# Patient Record
Sex: Female | Born: 1965 | Race: White | Hispanic: No | Marital: Married | Smoking: Never smoker
Health system: Southern US, Community
[De-identification: ages and names within clinical notes are randomized; demographics above are authoritative.]

## PROBLEM LIST (undated history)

## (undated) DIAGNOSIS — E119 Type 2 diabetes mellitus without complications: Secondary | ICD-10-CM

## (undated) DIAGNOSIS — N183 Chronic kidney disease, stage 3 (moderate): Secondary | ICD-10-CM

## (undated) DIAGNOSIS — N289 Disorder of kidney and ureter, unspecified: Secondary | ICD-10-CM

## (undated) DIAGNOSIS — E1122 Type 2 diabetes mellitus with diabetic chronic kidney disease: Secondary | ICD-10-CM

## (undated) DIAGNOSIS — L0390 Cellulitis, unspecified: Secondary | ICD-10-CM

## (undated) DIAGNOSIS — J45909 Unspecified asthma, uncomplicated: Secondary | ICD-10-CM

## (undated) DIAGNOSIS — G43909 Migraine, unspecified, not intractable, without status migrainosus: Secondary | ICD-10-CM

---

## 2001-01-27 ENCOUNTER — Observation Stay (HOSPITAL_COMMUNITY): Admission: EM | Admit: 2001-01-27 | Discharge: 2001-01-28 | Payer: Self-pay | Admitting: Emergency Medicine

## 2001-01-28 ENCOUNTER — Encounter: Payer: Self-pay | Admitting: *Deleted

## 2002-09-21 ENCOUNTER — Encounter: Payer: Self-pay | Admitting: *Deleted

## 2002-09-21 ENCOUNTER — Emergency Department (HOSPITAL_COMMUNITY): Admission: EM | Admit: 2002-09-21 | Discharge: 2002-09-21 | Payer: Self-pay

## 2003-08-17 DIAGNOSIS — E119 Type 2 diabetes mellitus without complications: Secondary | ICD-10-CM

## 2003-08-17 HISTORY — DX: Type 2 diabetes mellitus without complications: E11.9

## 2011-01-01 NOTE — Discharge Summary (Signed)
[place]. [facility]  Patient:    Rose Murray, Rose Murray                       MRN: [id number] Adm. Date:  [id number] Disc. Date: [id number] Attending:  Glennon Hamilton Dictator:   Delton See, P.A. CC:         Delaney Meigs, M.D.   Referring Physician Discharge [place]  DATE OF BIRTH:  [birth year]  HISTORY OF PRESENT ILLNESS:  Mrs. Rose Murray is a 45 year old female with a history of supraventricular tachycardia seen in the past by Dr. Graciela Husbands some time in 1998.  She has been treated with Cardizem for her SVT.  She was admitted through the emergency department to [facility] on January 27, 2001 with substernal chest pain which had been present for approximately one week.  Nitroglycerin helped.  There has been no shortness of breath or diaphoresis associated with this chest pain.  The pain was not felt to be exertional.  She had called her primary physicians office and was told to come to the emergency room for further evaluation.  She was pain-free when seen in the emergency room.  PAST MEDICAL HISTORY:  Significant for hypertension, degenerative joint disease, and gastroesophageal reflux disease.  ALLERGIES:  PENICILLIN.  MEDICATIONS:  Nexium and Cardizem - dosage is not available.  SOCIAL HISTORY:  The patient does not smoke.  She is married.  HOSPITAL COURSE:  As noted, this patient was admitted to [facility] for further evaluation of chest pain.  She was seen by Dr. Lewayne Bunting at time of admission.  Her pain was felt to be atypical.  She was to have enzymes checked and to be observed on telemetry.  She had more pain and inpatient exercise Cardiolite would be performed; if she was pain-free and her enzymes were negative she would be sent home with plans for an outpatient Cardiolite.  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  She refused to have an IV.  She refused to have further enzymes drawn.  The initial  enzymes drawn were reportedly negative.  She was seen on rounds by Dr. Eden Emms on January 28, 2001.  The patient was convinced that her symptoms were coming from her gallbladder.  A gallbladder ultrasound was ordered.  This came back negative except for a fatty liver. The plan was to proceed with discharge.  LABORATORY DATA:  Cardiac enzymes were negative x 1.  INR 1.1.  As noted, the patient refused to have further labs drawn.  EKG showed normal sinus rhythm, rate 85 beats per minute, and was interpreted as a normal EKG.  As noted, a gallbladder ultrasound was negative except for a fatty liver.  DISCHARGE MEDICATIONS:  The patient was told to continue her same medications, Nexium and Cardizem, as previously taken.  ACTIVITY:  As tolerated.  DIET:  As previously.  FOLLOW-UP:  As noted, the patient was to be scheduled for an outpatient stress test.  She was told to call Dr. Koren Bound office on Monday for follow-up appointment.  She was to see Dr. Lysbeth Galas in one to two weeks.  PROBLEM LIST AT TIME OF DISCHARGE: 1. Chest pain felt to be atypical.  The patient refused to have further    cardiac enzymes drawn.  She refused to have an IV. 2. Gallbladder ultrasound performed this admission negative except for a fatty    liver. 3. History of hypertension. 4. History of degenerative joint  disease. 5. History of gastroesophageal reflux disease. 6. History of supraventricular tachycardia from 1998. DD:  01/28/01 TD:  01/28/01 Job: [id number]

## 2011-01-01 NOTE — H&P (Signed)
[place]. [facility]  Patient:    Rose Murray, Rose Murray                       MRN: [id number] Adm. Date:  [id number] Attending:  Glennon Hamilton Dictator:   Abelino Derrick, P.A.C. [facility]                         History and Physical  HISTORY OF PRESENT ILLNESS:  Ms. Rose Murray is a 45 year old female followed by Dr. Kathi Der who was referred to the emergency room tonight for evaluation of chest pain. The patient complains of intermittent chest tightness since Sunday. There is no nausea, vomiting, or diaphoresis with this. She has had some reflux symptoms and is scheduled to have a gallbladder ultrasound next week. Apparently she did take a nitroglycerin and this seemed to help. She has seen our group in the past, Dr. Graciela Husbands saw her for PSVT, based on symptoms.  PAST MEDICAL HISTORY:  Remarkable for hypertension and DJD. She had a normal echo July of 1998.  CURRENT MEDICATIONS:  Cardizem and Nexium.  ALLERGIES:  PENICILLIN which causes shortness of breath and a rash.  SOCIAL HISTORY:  She is a nonsmoker, she is married.  FAMILY HISTORY:  Father died in his 10s of coronary disease. Mother is alive at 62.  REVIEW OF SYSTEMS:  Essentially unremarkable except as noted above. There is no history of peptic ulcer disease, or GI bleeding. She has had palpitations as noted in the past associated with presyncope.  PHYSICAL EXAMINATION:  VITAL SIGNS:  Blood pressure 117/76, pulse 85, respirations 18.  GENERAL:  She is a well-developed, obese female in no acute distress.  HEENT:  Normocephalic. Extraocular movements intact. Sclera is nonicteric. Conjunctiva within normal limits.  NECK:  ______ without JVD.  CHEST:  Clear to auscultation and percussion.  CARDIAC:  Reveals a regular rate and rhythm without murmurs, rubs or gallops. Normal S1, S2.  ABDOMEN:  Obese, nontender.  EXTREMITIES:  Without edema. Distal pulses are intact.  LABORATORY DATA:  EKG shows sinus  rhythm without acute changes.  Labs done recently sent with the patient from January 26, 2001 show a LDL of 59, HDL 46, BUN 9, creatinine 0.6, sodium 135, potassium 4.7, TSH 2.66, amylase 31, white count 7.7, hemoglobin 12.6, hematocrit 39.2, platelets 258.  IMPRESSION: 1. Chest pain, rule out... DD:  01/27/01 TD:  01/27/01 Job: [id number]

## 2011-01-01 NOTE — Consult Note (Signed)
Rose Murray, HINKSON NO.:  [id number]   MEDICAL RECORD NO.:  [id number]                   PATIENT TYPE:  EMS   LOCATION:  MINO                                 FACILITY:  [facility]   PHYSICIAN:  Candy Sledge, M.D.            DATE OF BIRTH:  13-Dec-1965   DATE OF CONSULTATION:  09/21/2002  DATE OF DISCHARGE:                                   CONSULTATION   REFERRING PHYSICIAN:  Gaynelle Cage, M.D. in [place].   CHIEF COMPLAINT:  Bilateral facial weakness.   HISTORY OF PRESENT ILLNESS:  The patient is a 45 year old right-handed  married female.  She had an impacted molar in 11/03, treated with  erythromycin initially.  Then her tooth was pulled in 12/03.  She was  somewhat better after, and then suddenly hearing in her left ear with  swelling around the ear and drainage out of the ear at one point.  Her  hearing came back subsequently, and then she started running fevers and had  swelling of lymph nodes.  She then developed sores on the tip of her tongue  and the roof of her mouth.  She presented to [facility] on 08/31/02, and was  treated with Levaquin which made her feel much worse.  On 09/01/02, she awoke  with left facial weakness, and on 09/02/02, had right facial weakness as  well.  She saw Paulita Cradle at [facility], and was  given [facility] mouthwash.  Her sores cleared up.  She was running fevers off  and on for the past couple of weeks, and complaining of migratory head pain  and swelling of her jaw.  She has had no improvement of the facial weakness  over the past couple of weeks.   LABORATORY DATA:  Normal white blood cell count.  Labs drawn two weeks ago  were probably hemolyzed and showed an elevated potassium of 8.2, and a  glucose of 250.  There were elevated transaminases as well.  These findings  were presumably being repeated.   PAST MEDICAL HISTORY:  1. Borderline diabetes.  2. She has been previously treated  for hypertension, but not currently.  3. She has an unknown heart rhythm problem.   MEDICATIONS:  On no chronic medications.   ALLERGIES:  1. PENICILLIN.  2. VICODIN.  3. BENADRYL.  4. LEVAQUIN.   FAMILY HISTORY:  Father deceased at an early age with a myocardial  infarction.  Mother has borderline diabetes.   SOCIAL HISTORY:  The patient is married, has one son.  She does not smoke or  use alcohol.   PHYSICAL EXAMINATION:  GENERAL:  This is a well-developed, well-nourished,  overweight, pleasant lady in no acute distress.  VITAL SIGNS:  Blood pressure is 143/83, pulse 84, respirations 16,  temperature 100.6.  HEENT: Cranium is normocephalic, atraumatic.  Oropharynx reveals mild  erythema involving the tonsillar pillars.  There are no  obvious sores.  The  external auditory canals are clear.  Tympanic membranes show good light  reflex.  NECK:  Supple without bruits.  I palpate no enlarged lymph nodes.  HEART:  Regular rate and rhythm without murmurs.  LUNGS:  Clear to auscultation.  EXTREMITIES:  Without cyanosis, clubbing, or edema.  NEUROLOGIC:  The patient is alert and oriented.  His speech is normal.  Extraocular movements are full and without nystagmus.  Pupils equal, round,  reactive to light.  Visual fields are full to confrontation.  Funduscopic  examination reveals sharp disk margins.  Facial sensation is intact  bilaterally.  There is bilateral focal facial weakness, the patient is  unable to wrinkle up the forehead or smile.  She is able to occlude both  eyes.  Tongue is diffuse in the midline.  Hearing is intact to finger rub  bilaterally.  Motor assessment reveals negative rabarre.  There is 5/5  strength throughout the upper and lower extremities.  Deep tendon reflexes  are 2+ and symmetric, and the plantar responses are downgoing bilaterally.  Sensation examination is intact to pinprick, temperature, and vibratory  sensation.  Cerebellar testing reveals normal  finger-nose-finger.   IMPRESSION:  Bilateral seventh nerve palsy, question viral.  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  The patient's  blood sugar previously drawn of 254, is somewhat disturbing, and she may be  going in to a phase of frank diabetes.  Certainly, seventh nerve palsies are  more common with this condition.  An MRI of the brain was obtained to rule  out intracranial pathology, and the patient refused contrast.  This was a  normal study.   PLAN:  Discharge the patient to home.  She will treat her low-grade  temperatures with Tylenol.  MRI did show some maxillary sinus disease which  may be a source of her temperatures.  I would certainly follow up on her  labs and recheck her sugars as needed.  She is not a candidate for a  specific treatment with prednisone or Valtrex at this time, and her  prognosis generally should be quite good for recovery of facial strength.  I  have instructed her to apply Lacrilube ointment to her eyes at night, use  artificial tears as needed during the day.  We will be happy to follow up  with her in the office as needed.   I appreciate the opportunity to evaluate this interesting patient.                                                Candy Sledge, M.D.    JJS/MEDQ  D:  09/21/2002  T:  09/22/2002  Job:  [id number]   cc:   Gaynelle Cage  [address]  [place]  [place] [address]  Fax: [phone]

## 2012-08-16 DIAGNOSIS — E1122 Type 2 diabetes mellitus with diabetic chronic kidney disease: Secondary | ICD-10-CM

## 2012-08-16 DIAGNOSIS — N183 Chronic kidney disease, stage 3 unspecified: Secondary | ICD-10-CM

## 2012-08-16 HISTORY — DX: Type 2 diabetes mellitus with diabetic chronic kidney disease: E11.22

## 2012-08-16 HISTORY — DX: Chronic kidney disease, stage 3 unspecified: N18.30

## 2013-02-03 DIAGNOSIS — R05 Cough: Secondary | ICD-10-CM

## 2013-10-15 ENCOUNTER — Telehealth: Payer: Self-pay | Admitting: *Deleted

## 2014-05-03 ENCOUNTER — Encounter (HOSPITAL_COMMUNITY): Payer: Self-pay | Admitting: Emergency Medicine

## 2014-05-03 ENCOUNTER — Emergency Department (HOSPITAL_COMMUNITY)
Admission: EM | Admit: 2014-05-03 | Discharge: 2014-05-04 | Disposition: A | Discharge status: Home / Self Care | Payer: Self-pay | Attending: Emergency Medicine | Admitting: Emergency Medicine

## 2014-05-03 DIAGNOSIS — N183 Chronic kidney disease, stage 3 unspecified: Secondary | ICD-10-CM | POA: Insufficient documentation

## 2014-05-03 DIAGNOSIS — I87309 Chronic venous hypertension (idiopathic) without complications of unspecified lower extremity: Secondary | ICD-10-CM | POA: Insufficient documentation

## 2014-05-03 DIAGNOSIS — E1129 Type 2 diabetes mellitus with other diabetic kidney complication: Secondary | ICD-10-CM | POA: Insufficient documentation

## 2014-05-03 DIAGNOSIS — M7989 Other specified soft tissue disorders: Secondary | ICD-10-CM | POA: Insufficient documentation

## 2014-05-03 DIAGNOSIS — Z872 Personal history of diseases of the skin and subcutaneous tissue: Secondary | ICD-10-CM | POA: Insufficient documentation

## 2014-05-03 DIAGNOSIS — R600 Localized edema: Secondary | ICD-10-CM

## 2014-05-03 DIAGNOSIS — J45909 Unspecified asthma, uncomplicated: Secondary | ICD-10-CM | POA: Insufficient documentation

## 2014-05-03 DIAGNOSIS — I87303 Chronic venous hypertension (idiopathic) without complications of bilateral lower extremity: Secondary | ICD-10-CM

## 2014-05-03 HISTORY — DX: Cellulitis, unspecified: L03.90

## 2014-05-03 HISTORY — DX: Chronic kidney disease, stage 3 (moderate): N18.3

## 2014-05-03 HISTORY — DX: Type 2 diabetes mellitus with diabetic chronic kidney disease: E11.22

## 2014-05-03 HISTORY — DX: Unspecified asthma, uncomplicated: J45.909

## 2014-05-03 HISTORY — DX: Type 2 diabetes mellitus without complications: E11.9

## 2014-05-03 HISTORY — DX: Disorder of kidney and ureter, unspecified: N28.9

## 2014-05-03 LAB — COMPREHENSIVE METABOLIC PANEL
ALT: 10 U/L (ref 0–35)
AST: 23 U/L (ref 0–37)
Albumin: 2.5 g/dL — ABNORMAL LOW (ref 3.5–5.2)
Alkaline Phosphatase: 64 U/L (ref 39–117)
Anion gap: 8 (ref 5–15)
BUN: 21 mg/dL (ref 6–23)
CO2: 25 mEq/L (ref 19–32)
Calcium: 8.2 mg/dL — ABNORMAL LOW (ref 8.4–10.5)
Chloride: 107 mEq/L (ref 96–112)
Creatinine, Ser: 1.22 mg/dL — ABNORMAL HIGH (ref 0.50–1.10)
GFR calc Af Amer: 59 mL/min — ABNORMAL LOW (ref >90)
GFR calc non Af Amer: 51 mL/min — ABNORMAL LOW (ref >90)
Glucose, Bld: 119 mg/dL — ABNORMAL HIGH (ref 70–99)
Potassium: 4.2 mEq/L (ref 3.7–5.3)
Sodium: 140 mEq/L (ref 137–147)
Total Bilirubin: <0.2 mg/dL — ABNORMAL LOW (ref 0.3–1.2)
Total Protein: 6.3 g/dL (ref 6.0–8.3)

## 2014-05-03 LAB — CBC
HCT: 30.2 % — ABNORMAL LOW (ref 36.0–46.0)
Hemoglobin: 9.9 g/dL — ABNORMAL LOW (ref 12.0–15.0)
MCH: 27 pg (ref 26.0–34.0)
MCHC: 32.8 g/dL (ref 30.0–36.0)
MCV: 82.5 fL (ref 78.0–100.0)
Platelets: 182 10*3/uL (ref 150–400)
RBC: 3.66 MIL/uL — ABNORMAL LOW (ref 3.87–5.11)
RDW: 14.7 % (ref 11.5–15.5)
WBC: 5.5 10*3/uL (ref 4.0–10.5)

## 2014-05-03 LAB — URINE MICROSCOPIC-ADD ON

## 2014-05-03 LAB — URINALYSIS, ROUTINE W REFLEX MICROSCOPIC
Bilirubin Urine: NEGATIVE
Glucose, UA: 250 mg/dL — AB
Ketones, ur: NEGATIVE mg/dL
Leukocytes, UA: NEGATIVE
Nitrite: NEGATIVE
Protein, ur: >300 mg/dL — AB
Specific Gravity, Urine: 1.02 (ref 1.005–1.030)
Urobilinogen, UA: 0.2 mg/dL (ref 0.0–1.0)
pH: 6.5 (ref 5.0–8.0)

## 2014-05-03 MED ORDER — ENOXAPARIN SODIUM 100 MG/ML ~~LOC~~ SOLN
1.0000 mg/kg | Freq: Once | SUBCUTANEOUS | Status: AC
Start: 2014-05-03 — End: 2014-05-03
  Administered 2014-05-03: 155 mg via SUBCUTANEOUS
  Filled 2014-05-03: qty 2

## 2014-05-03 NOTE — ED Provider Notes (Signed)
CSN: [id number]     Arrival date & time 05/03/14  2117 History   First MD Initiated Contact with Patient 05/03/14 2154   This chart was scribed for Lyanne Co, MD by Gwenevere Abbot, ED scribe. This patient was seen in room APA09/APA09 and the patient's care was started at 10:08 PM.    Chief Complaint  Patient presents with  . Leg Swelling   The history is provided by the patient. No language interpreter was used.   HPI Comments:  Rose Murray is a 48 y.o. female with a h/o of DM who presents to the Emergency Department complaining of gradually worsening swelling bilaterally of the lower extremities over the past 2 months. Pt reports that she attempted to use compression stockings, without relief. Pt reports that she was seen at [facility] and at [facility], and sent through a great deal of testing. Pt reports that doctor did say she was stage 3 kidney disease. Pt denies CP. Pt has h/o asthma. Pt does not have a PCP because she does not have insurance.   Past Medical History  Diagnosis Date  . Diabetes mellitus without complication   . Renal disorder   . Stage 3 chronic kidney disease due to type 2 diabetes mellitus   . Cellulitis   . Asthma    History reviewed. No pertinent past surgical history. History reviewed. No pertinent family history. History  Substance Use Topics  . Smoking status: Never Smoker   . Smokeless tobacco: Not on file  . Alcohol Use: No   OB History   Grav Para Term Preterm Abortions TAB SAB Ect Mult Living                 Review of Systems  Cardiovascular: Positive for leg swelling.  All other systems reviewed and are negative.   Allergies  Bee venom; Vicodin; and Benadryl  Home Medications   Prior to Admission medications   Not on File   BP 196/93  Pulse 84  Temp(Src) 97.8 F (36.6 C) (Oral)  Resp 20  Ht  (1.727 m)  Wt 344 lb 12.8 oz (156.4 kg)  BMI 52.44 kg/m2  SpO2 100%  LMP 04/16/2014 Physical Exam  Nursing note  and vitals reviewed. Constitutional: She is oriented to person, place, and time. She appears well-developed and well-nourished. No distress.  HENT:  Head: Normocephalic and atraumatic.  Eyes: EOM are normal.  Neck: Normal range of motion.  Cardiovascular: Normal rate, regular rhythm and normal heart sounds.   2+ pitting edema bilaterally of the lower extremities. Changes consistent with chronic venous insufficiency.   Pulmonary/Chest: Effort normal and breath sounds normal.  Abdominal: Soft. She exhibits no distension. There is no tenderness.  Musculoskeletal: Normal range of motion.     Neurological: She is alert and oriented to person, place, and time.  Skin: Skin is warm and dry.  Psychiatric: She has a normal mood and affect. Judgment normal.    ED Course  Procedures  DIAGNOSTIC STUDIES: Oxygen Saturation is 100% on RA, normal by my interpretation.  COORDINATION OF CARE:   Labs Review Labs Reviewed  CBC - Abnormal; Notable for the following:    RBC 3.66 (*)    Hemoglobin 9.9 (*)    HCT 30.2 (*)    All other components within normal limits  URINALYSIS, ROUTINE W REFLEX MICROSCOPIC - Abnormal; Notable for the following:    Glucose, UA 250 (*)    Hgb urine dipstick MODERATE (*)  Protein, ur >300 (*)    All other components within normal limits  COMPREHENSIVE METABOLIC PANEL - Abnormal; Notable for the following:    Glucose, Bld 119 (*)    Creatinine, Ser 1.22 (*)    Calcium 8.2 (*)    Albumin 2.5 (*)    Total Bilirubin <0.2 (*)    GFR calc non Af Amer 51 (*)    GFR calc Af Amer 59 (*)    All other components within normal limits  URINE MICROSCOPIC-ADD ON    Imaging Review No results found.   EKG Interpretation None      MDM   Final diagnoses:  Bilateral edema of lower extremity  Stasis edema of both lower extremities   No signs of cellulitis. Well appearing. Vitals normal. Suspect chronic venous insufficiency. Recommended compression stockings,  elevation, salt restriction and pcp follow up. Very low suspicion for DVT. Will have pt return in AM for bilateral lower extremity duplex to evaluate for DVT to close the loop and help expedite future evaluations. Pt understands to return to ER for new or worsening symptoms.    I personally performed the services described in this documentation, which was scribed in my presence. The recorded information has been reviewed and is accurate.      Lyanne Co, MD 05/11/14 2139

## 2014-05-03 NOTE — ED Notes (Signed)
Patient reports leg swelling. States has been diagnosed with cellulitis in the past. States symptoms have occurred for approximately a year, but have worsened over the past couple months.

## 2014-05-03 NOTE — Discharge Instructions (Signed)

## 2014-05-04 ENCOUNTER — Ambulatory Visit (HOSPITAL_COMMUNITY)
Admit: 2014-05-04 | Discharge: 2014-05-04 | Disposition: A | Discharge status: Home / Self Care | Payer: Self-pay | Source: Ambulatory Visit | Attending: Emergency Medicine | Admitting: Emergency Medicine

## 2014-05-04 ENCOUNTER — Other Ambulatory Visit (HOSPITAL_COMMUNITY): Payer: Self-pay | Admitting: Emergency Medicine

## 2014-05-04 DIAGNOSIS — M7989 Other specified soft tissue disorders: Secondary | ICD-10-CM

## 2014-05-04 NOTE — ED Provider Notes (Signed)
Rose Murray is a 48 y.o. female who returns to the ED for bilateral lower extremity  US Venous. She has a history of kidney disease and has bilateral lower extremity edema that has been chronic over the past year but worse recently.   US Venous Img Lower Bilateral  05/04/2014   CLINICAL DATA:  Bilateral lower extremity swelling.  EXAM: BILATERAL LOWER EXTREMITY VENOUS DOPPLER ULTRASOUND  TECHNIQUE: Gray-scale sonography with graded compression, as well as color Doppler and duplex ultrasound were performed to evaluate the lower extremity deep venous systems from the level of the common femoral vein and including the common femoral, femoral, profunda femoral, popliteal and calf veins including the posterior tibial, peroneal and gastrocnemius veins when visible. The superficial great saphenous vein was also interrogated. Spectral Doppler was utilized to evaluate flow at rest and with distal augmentation maneuvers in the common femoral, femoral and popliteal veins.  COMPARISON:  None.  FINDINGS: Examination is suboptimal due to patient body habitus and extensive lower extremity edema. Calf veins could not be evaluated. Popliteal veins were suboptimally evaluated.  RIGHT LOWER EXTREMITY  Common Femoral Vein: No evidence of thrombus. Normal compressibility, respiratory phasicity and response to augmentation.  Saphenofemoral Junction: No evidence of thrombus. Normal compressibility and flow on color Doppler imaging.  Profunda Femoral Vein: No evidence of thrombus. Normal compressibility and flow on color Doppler imaging.  Femoral Vein: No evidence of thrombus. Normal compressibility, respiratory phasicity and response to augmentation.  Popliteal Vein: No evidence of thrombus. Normal compressibility, respiratory phasicity and response to augmentation.  Superficial Great Saphenous Vein: No evidence of thrombus. Normal compressibility and flow on color Doppler imaging.  Venous Reflux:  None.  Other Findings:  Calf  edema.  LEFT LOWER EXTREMITY  Common Femoral Vein: No evidence of thrombus. Normal compressibility, respiratory phasicity and response to augmentation.  Saphenofemoral Junction: No evidence of thrombus. Normal compressibility and flow on color Doppler imaging.  Profunda Femoral Vein: No evidence of thrombus. Normal compressibility and flow on color Doppler imaging.  Femoral Vein: No evidence of thrombus. Normal compressibility, respiratory phasicity and response to augmentation.  Popliteal Vein: No evidence of thrombus. Normal compressibility, respiratory phasicity and response to augmentation.  Superficial Great Saphenous Vein: No evidence of thrombus. Normal compressibility and flow on color Doppler imaging.  Venous Reflux:  None.  Other Findings:  Calf edema.  IMPRESSION: No evidence of deep venous thrombosis. Calf veins could not be evaluated due to body habitus and extensive lower extremity edema.   Electronically Signed   By: Sebastian Ache   On: 05/04/2014 10:15    Results for orders placed during the hospital encounter of 05/03/14 (from the past 24 hour(s))  CBC     Status: Abnormal   Collection Time    05/03/14 10:05 PM      Result Value Ref Range   WBC 5.5  4.0 - 10.5 K/uL   RBC 3.66 (*) 3.87 - 5.11 MIL/uL   Hemoglobin 9.9 (*) 12.0 - 15.0 g/dL   HCT 63.7 (*) 85.8 - 85.0 %   MCV 82.5  78.0 - 100.0 fL   MCH 27.0  26.0 - 34.0 pg   MCHC 32.8  30.0 - 36.0 g/dL   RDW 27.7  41.2 - 87.8 %   Platelets 182  150 - 400 K/uL  COMPREHENSIVE METABOLIC PANEL     Status: Abnormal   Collection Time    05/03/14 10:05 PM      Result Value Ref Range   Sodium 140  137 - 147 mEq/L   Potassium 4.2  3.7 - 5.3 mEq/L   Chloride 107  96 - 112 mEq/L   CO2 25  19 - 32 mEq/L   Glucose, Bld 119 (*) 70 - 99 mg/dL   BUN 21  6 - 23 mg/dL   Creatinine, Ser 1.61 (*) 0.50 - 1.10 mg/dL   Calcium 8.2 (*) 8.4 - 10.5 mg/dL   Total Protein 6.3  6.0 - 8.3 g/dL   Albumin 2.5 (*) 3.5 - 5.2 g/dL   AST 23  0 - 37 U/L   ALT  10  0 - 35 U/L   Alkaline Phosphatase 64  39 - 117 U/L   Total Bilirubin <0.2 (*) 0.3 - 1.2 mg/dL   GFR calc non Af Amer 51 (*) >90 mL/min   GFR calc Af Amer 59 (*) >90 mL/min   Anion gap 8  5 - 15  URINALYSIS, ROUTINE W REFLEX MICROSCOPIC     Status: Abnormal   Collection Time    05/03/14 11:00 PM      Result Value Ref Range   Color, Urine YELLOW  YELLOW   APPearance CLEAR  CLEAR   Specific Gravity, Urine 1.020  1.005 - 1.030   pH 6.5  5.0 - 8.0   Glucose, UA 250 (*) NEGATIVE mg/dL   Hgb urine dipstick MODERATE (*) NEGATIVE   Bilirubin Urine NEGATIVE  NEGATIVE   Ketones, ur NEGATIVE  NEGATIVE mg/dL   Protein, ur >096 (*) NEGATIVE mg/dL   Urobilinogen, UA 0.2  0.0 - 1.0 mg/dL   Nitrite NEGATIVE  NEGATIVE   Leukocytes, UA NEGATIVE  NEGATIVE  URINE MICROSCOPIC-ADD ON     Status: None   Collection Time    05/03/14 11:00 PM      Result Value Ref Range   Squamous Epithelial / LPF RARE  RARE   RBC / HPF 3-6  <3 RBC/hpf    Discussed results of Ultrasound with the patient. She will [facility] for [facility] evaluation. BP 196/93  Pulse 84  Temp(Src) 97.8 F (36.6 C) (Oral)  Resp 20  Ht  (1.727 m)  Wt 344 lb 12.8 oz (156.4 kg)  BMI 52.44 kg/m2  SpO2 100%  LMP 04/16/2014  She will return here as needed for worsening symptoms.   [facility] Orlene Och, NP 05/04/14 1049

## 2014-05-06 NOTE — ED Notes (Signed)
Pt called and stated that when speaking with the NP when she came back on Saturday for [place], NP told her to stay home and rest with feet up Saturday and Sunday. Note was only given for Saturday, which pt stated was a mistake. I took pts word after reviewing the chart and made another note stating not to return until 9/21.

## 2014-05-06 NOTE — ED Provider Notes (Signed)
Medical screening examination/treatment/procedure(s) were performed by non-physician practitioner and as supervising physician I was immediately available for consultation/collaboration.   EKG Interpretation None        Alexyss Balzarini L Maelin Kurkowski, MD 05/06/14 0829 

## 2014-05-06 NOTE — ED Notes (Signed)
Pt called and stated that NP explained to her that she would be out

## 2014-05-20 ENCOUNTER — Ambulatory Visit: Payer: Self-pay | Attending: Internal Medicine

## 2014-05-28 ENCOUNTER — Ambulatory Visit: Payer: Self-pay | Attending: Family Medicine | Admitting: Family Medicine

## 2014-05-28 ENCOUNTER — Encounter: Payer: Self-pay | Admitting: Family Medicine

## 2014-05-28 VITALS — BP 188/117 | HR 83 | Temp 98.0°F | Resp 18 | Ht 68.0 in | Wt 342.0 lb

## 2014-05-28 DIAGNOSIS — I872 Venous insufficiency (chronic) (peripheral): Secondary | ICD-10-CM

## 2014-05-28 DIAGNOSIS — N183 Chronic kidney disease, stage 3 unspecified: Secondary | ICD-10-CM

## 2014-05-28 DIAGNOSIS — I8312 Varicose veins of left lower extremity with inflammation: Secondary | ICD-10-CM | POA: Insufficient documentation

## 2014-05-28 DIAGNOSIS — R739 Hyperglycemia, unspecified: Secondary | ICD-10-CM

## 2014-05-28 DIAGNOSIS — E119 Type 2 diabetes mellitus without complications: Secondary | ICD-10-CM | POA: Insufficient documentation

## 2014-05-28 DIAGNOSIS — I8311 Varicose veins of right lower extremity with inflammation: Secondary | ICD-10-CM

## 2014-05-28 DIAGNOSIS — R238 Other skin changes: Secondary | ICD-10-CM | POA: Insufficient documentation

## 2014-05-28 DIAGNOSIS — Z113 Encounter for screening for infections with a predominantly sexual mode of transmission: Secondary | ICD-10-CM

## 2014-05-28 DIAGNOSIS — E669 Obesity, unspecified: Secondary | ICD-10-CM | POA: Insufficient documentation

## 2014-05-28 DIAGNOSIS — E118 Type 2 diabetes mellitus with unspecified complications: Secondary | ICD-10-CM

## 2014-05-28 DIAGNOSIS — I10 Essential (primary) hypertension: Secondary | ICD-10-CM

## 2014-05-28 DIAGNOSIS — D649 Anemia, unspecified: Secondary | ICD-10-CM

## 2014-05-28 DIAGNOSIS — J4521 Mild intermittent asthma with (acute) exacerbation: Secondary | ICD-10-CM

## 2014-05-28 DIAGNOSIS — Z79899 Other long term (current) drug therapy: Secondary | ICD-10-CM | POA: Insufficient documentation

## 2014-05-28 DIAGNOSIS — J45909 Unspecified asthma, uncomplicated: Secondary | ICD-10-CM | POA: Insufficient documentation

## 2014-05-28 DIAGNOSIS — M7989 Other specified soft tissue disorders: Secondary | ICD-10-CM | POA: Insufficient documentation

## 2014-05-28 LAB — POCT GLYCOSYLATED HEMOGLOBIN (HGB A1C): HEMOGLOBIN A1C: 6.3

## 2014-05-28 LAB — GLUCOSE, POCT (MANUAL RESULT ENTRY): POC Glucose: 91 mg/dl (ref 70–99)

## 2014-05-28 MED ORDER — WHITE PETROLATUM GEL: 1.0000 "application " | Status: AC | PRN

## 2014-05-28 MED ORDER — METFORMIN HCL ER 500 MG PO TB24: 1000.0000 mg | ORAL_TABLET | Freq: Every day | ORAL | Status: DC

## 2014-05-28 MED ORDER — LOSARTAN POTASSIUM 50 MG PO TABS: 50.0000 mg | ORAL_TABLET | Freq: Every day | ORAL | Status: DC

## 2014-05-28 MED ORDER — ALBUTEROL SULFATE HFA 108 (90 BASE) MCG/ACT IN AERS: 2.0000 | INHALATION_SPRAY | Freq: Four times a day (QID) | RESPIRATORY_TRACT | Status: DC | PRN

## 2014-05-28 MED ORDER — HYDROCHLOROTHIAZIDE 25 MG PO TABS: 25.0000 mg | ORAL_TABLET | Freq: Every day | ORAL | Status: DC

## 2014-05-28 NOTE — Assessment & Plan Note (Signed)
Normocytic anemia Check iron studies

## 2014-05-28 NOTE — Assessment & Plan Note (Signed)
Hep C (husband with Hep C), HIV, RPR

## 2014-05-28 NOTE — Progress Notes (Signed)
Establish Care Complaining of leg swelling, blisters itching and pain going on for about one year Hx DM

## 2014-05-28 NOTE — Assessment & Plan Note (Signed)
A: severe hypertension P: HCTZ 25  Losartan 50  CMP

## 2014-05-28 NOTE — Assessment & Plan Note (Signed)
A: check CMP P; Start ARB F/u cr in 2-4 weeks after being on ARB

## 2014-05-28 NOTE — Assessment & Plan Note (Addendum)
A: venous stasis with dermatitis. Multifactorial from HTN, anemia, obesity. R/o CHF, r/o pelvic mass.  P: Elevate legs HCTZ ECHO Pelvic exam  Iron studies proBNP vaseline to legs Handicap placard

## 2014-05-28 NOTE — Progress Notes (Signed)
   Subjective:    Patient ID: Rose Murray, female    DOB: 1965-10-21, 48 y.o.   MRN: [id number] CC: establish care, leg pain, HTN   HPI  1. HTN: patient denies history of HTN. Reports being on ACEi for diabetes and lasix for LE edema. She admits to DOE and SOB. Denies orthopnea. Denies HA,CP, vision change. Has significant LE edema.   2. Leg swelling: x one year. Worsening. Associated with tense bulla that occasionally rupture. Has hypertension.  Has anemia. Obese. No pelvic pain or fullness. No orthopnea.   Soc Hx: chronic non smoker  Review of Systems As per HPI  History of palpitations     Objective:   Physical Exam BP 188/117  Pulse 83  Temp(Src) 98 F (36.7 C) (Oral)  Resp 18  Ht 5\' 8"  (1.727 m)  Wt 342 lb (155.13 kg)  BMI 52.01 kg/m2  SpO2 100%  LMP 05/25/2014 BP Readings from Last 3 Encounters:  05/28/14 188/117  05/03/14 196/93  General appearance: alert, cooperative, no distress and morbidly obese Lungs: clear to auscultation bilaterally Heart: regular rate and rhythm, S1, S2 normal, no murmur, click, rub or gallop Extremities: 2 + edema b/l, tense bulla on lower legs, mild erythema, no streaking, no skin dimpling, no warmth   Lab Results  Component Value Date   HGBA1C 6.3 05/28/2014       Assessment & Plan:

## 2014-05-28 NOTE — Patient Instructions (Addendum)
Mrs. Rose Murray,  Thank you for coming in today. It was a pleasure meeting you. I look forward to being your primary doctor.   1. For HTN: Losartan and HCTZ  2. DM2: well controlled Continue metformin Stop amaryl   3. Venous stasis: elevate legs at all times while resting above the level of your heart.  apply vaseline to protect skin.  We will work you up for possible cuases and treat known contributors: HTN, anemia, obesity.  Go for ECHO and chest x-ray [facility])     F/u in 1-2 weeks for pelvic exam and pap.   Dr. Armen PickupFunches

## 2014-05-28 NOTE — Assessment & Plan Note (Signed)
A: well controlled P: Continue metformin 1000 mg daily

## 2014-05-29 LAB — MICROALBUMIN / CREATININE URINE RATIO
CREATININE, URINE: 53.1 mg/dL
MICROALB UR: 598.9 mg/dL — AB (ref <2.0)

## 2014-06-03 ENCOUNTER — Telehealth: Payer: Self-pay | Admitting: *Deleted

## 2014-06-03 NOTE — Telephone Encounter (Signed)
unable to contact pt, no answer

## 2014-06-03 NOTE — Telephone Encounter (Signed)
Message copied by Dyann KiefGIRALDEZ, Alenah Sarria M on Mon Jun 03, 2014  4:36 PM ------      Message from: Dessa PhiFUNCHES, JOSALYN      Created: Wed May 29, 2014  1:42 PM       Elevated urine microalbumin, plan to treat BP aggressively to prevent progression of kidney injury ------

## 2014-06-05 ENCOUNTER — Ambulatory Visit (HOSPITAL_COMMUNITY): Payer: Self-pay

## 2014-06-06 ENCOUNTER — Other Ambulatory Visit: Payer: Self-pay

## 2014-06-06 ENCOUNTER — Encounter: Payer: Self-pay | Admitting: Family Medicine

## 2014-06-09 ENCOUNTER — Emergency Department (HOSPITAL_COMMUNITY): Payer: Self-pay

## 2014-06-09 ENCOUNTER — Encounter (HOSPITAL_COMMUNITY): Payer: Self-pay | Admitting: Emergency Medicine

## 2014-06-09 ENCOUNTER — Observation Stay (HOSPITAL_COMMUNITY)
Admission: EM | Admit: 2014-06-09 | Discharge: 2014-06-11 | Disposition: A | Discharge status: Home / Self Care | Payer: Self-pay | Attending: Internal Medicine | Admitting: Internal Medicine

## 2014-06-09 DIAGNOSIS — N289 Disorder of kidney and ureter, unspecified: Secondary | ICD-10-CM | POA: Insufficient documentation

## 2014-06-09 DIAGNOSIS — R4182 Altered mental status, unspecified: Secondary | ICD-10-CM | POA: Diagnosis present

## 2014-06-09 DIAGNOSIS — R519 Headache, unspecified: Secondary | ICD-10-CM

## 2014-06-09 DIAGNOSIS — J45909 Unspecified asthma, uncomplicated: Secondary | ICD-10-CM | POA: Insufficient documentation

## 2014-06-09 DIAGNOSIS — D649 Anemia, unspecified: Secondary | ICD-10-CM | POA: Diagnosis present

## 2014-06-09 DIAGNOSIS — N183 Chronic kidney disease, stage 3 unspecified: Secondary | ICD-10-CM

## 2014-06-09 DIAGNOSIS — G459 Transient cerebral ischemic attack, unspecified: Secondary | ICD-10-CM

## 2014-06-09 DIAGNOSIS — G43109 Migraine with aura, not intractable, without status migrainosus: Secondary | ICD-10-CM

## 2014-06-09 DIAGNOSIS — R0789 Other chest pain: Secondary | ICD-10-CM

## 2014-06-09 DIAGNOSIS — Z872 Personal history of diseases of the skin and subcutaneous tissue: Secondary | ICD-10-CM | POA: Insufficient documentation

## 2014-06-09 DIAGNOSIS — R079 Chest pain, unspecified: Secondary | ICD-10-CM

## 2014-06-09 DIAGNOSIS — I872 Venous insufficiency (chronic) (peripheral): Secondary | ICD-10-CM

## 2014-06-09 DIAGNOSIS — I160 Hypertensive urgency: Secondary | ICD-10-CM

## 2014-06-09 DIAGNOSIS — R51 Headache: Secondary | ICD-10-CM

## 2014-06-09 DIAGNOSIS — I10 Essential (primary) hypertension: Secondary | ICD-10-CM | POA: Diagnosis present

## 2014-06-09 DIAGNOSIS — G4489 Other headache syndrome: Secondary | ICD-10-CM

## 2014-06-09 DIAGNOSIS — I129 Hypertensive chronic kidney disease with stage 1 through stage 4 chronic kidney disease, or unspecified chronic kidney disease: Secondary | ICD-10-CM | POA: Insufficient documentation

## 2014-06-09 DIAGNOSIS — G458 Other transient cerebral ischemic attacks and related syndromes: Secondary | ICD-10-CM

## 2014-06-09 DIAGNOSIS — G43819 Other migraine, intractable, without status migrainosus: Principal | ICD-10-CM

## 2014-06-09 DIAGNOSIS — E119 Type 2 diabetes mellitus without complications: Secondary | ICD-10-CM | POA: Insufficient documentation

## 2014-06-09 DIAGNOSIS — R110 Nausea: Secondary | ICD-10-CM | POA: Insufficient documentation

## 2014-06-09 DIAGNOSIS — Z79899 Other long term (current) drug therapy: Secondary | ICD-10-CM | POA: Insufficient documentation

## 2014-06-09 DIAGNOSIS — G9340 Encephalopathy, unspecified: Secondary | ICD-10-CM

## 2014-06-09 DIAGNOSIS — R200 Anesthesia of skin: Secondary | ICD-10-CM

## 2014-06-09 DIAGNOSIS — E1122 Type 2 diabetes mellitus with diabetic chronic kidney disease: Secondary | ICD-10-CM

## 2014-06-09 HISTORY — DX: Migraine, unspecified, not intractable, without status migrainosus: G43.909

## 2014-06-09 LAB — URINALYSIS, ROUTINE W REFLEX MICROSCOPIC
BILIRUBIN URINE: NEGATIVE
Glucose, UA: 100 mg/dL — AB
Ketones, ur: NEGATIVE mg/dL
Leukocytes, UA: NEGATIVE
NITRITE: NEGATIVE
SPECIFIC GRAVITY, URINE: 1.018 (ref 1.005–1.030)
UROBILINOGEN UA: 0.2 mg/dL (ref 0.0–1.0)
pH: 7 (ref 5.0–8.0)

## 2014-06-09 LAB — COMPREHENSIVE METABOLIC PANEL
ALK PHOS: 87 U/L (ref 39–117)
ALT: 19 U/L (ref 0–35)
ANION GAP: 14 (ref 5–15)
AST: 21 U/L (ref 0–37)
Albumin: 3.2 g/dL — ABNORMAL LOW (ref 3.5–5.2)
BUN: 34 mg/dL — AB (ref 6–23)
CALCIUM: 9.3 mg/dL (ref 8.4–10.5)
CO2: 23 mEq/L (ref 19–32)
CREATININE: 1.52 mg/dL — AB (ref 0.50–1.10)
Chloride: 104 mEq/L (ref 96–112)
GFR calc Af Amer: 45 mL/min — ABNORMAL LOW (ref >90)
GFR calc non Af Amer: 39 mL/min — ABNORMAL LOW (ref >90)
Glucose, Bld: 93 mg/dL (ref 70–99)
POTASSIUM: 4.6 meq/L (ref 3.7–5.3)
Sodium: 141 mEq/L (ref 137–147)
TOTAL PROTEIN: 7.6 g/dL (ref 6.0–8.3)
Total Bilirubin: <0.2 mg/dL — ABNORMAL LOW (ref 0.3–1.2)

## 2014-06-09 LAB — CBC WITH DIFFERENTIAL/PLATELET
BASOS PCT: 0 % (ref 0–1)
Basophils Absolute: 0 10*3/uL (ref 0.0–0.1)
EOS PCT: 2 % (ref 0–5)
Eosinophils Absolute: 0.1 10*3/uL (ref 0.0–0.7)
HCT: 33.7 % — ABNORMAL LOW (ref 36.0–46.0)
HEMOGLOBIN: 11.1 g/dL — AB (ref 12.0–15.0)
Lymphocytes Relative: 14 % (ref 12–46)
Lymphs Abs: 1 10*3/uL (ref 0.7–4.0)
MCH: 26.7 pg (ref 26.0–34.0)
MCHC: 32.9 g/dL (ref 30.0–36.0)
MCV: 81.2 fL (ref 78.0–100.0)
MONOS PCT: 4 % (ref 3–12)
Monocytes Absolute: 0.3 10*3/uL (ref 0.1–1.0)
NEUTROS PCT: 80 % — AB (ref 43–77)
Neutro Abs: 6 10*3/uL (ref 1.7–7.7)
Platelets: 170 10*3/uL (ref 150–400)
RBC: 4.15 MIL/uL (ref 3.87–5.11)
RDW: 13.8 % (ref 11.5–15.5)
WBC: 7.5 10*3/uL (ref 4.0–10.5)

## 2014-06-09 LAB — CBG MONITORING, ED: Glucose-Capillary: 88 mg/dL (ref 70–99)

## 2014-06-09 LAB — URINE MICROSCOPIC-ADD ON

## 2014-06-09 LAB — PRO B NATRIURETIC PEPTIDE: Pro B Natriuretic peptide (BNP): 555.7 pg/mL — ABNORMAL HIGH (ref 0–125)

## 2014-06-09 LAB — RAPID URINE DRUG SCREEN, HOSP PERFORMED
Amphetamines: NOT DETECTED
Barbiturates: NOT DETECTED
Benzodiazepines: NOT DETECTED
COCAINE: NOT DETECTED
Opiates: NOT DETECTED
TETRAHYDROCANNABINOL: NOT DETECTED

## 2014-06-09 LAB — TROPONIN I

## 2014-06-09 MED ORDER — METOCLOPRAMIDE HCL 5 MG/ML IJ SOLN
5.0000 mg | Freq: Once | INTRAMUSCULAR | Status: AC
  Administered 2014-06-09: 5 mg via INTRAVENOUS
  Filled 2014-06-09: qty 2

## 2014-06-09 MED ORDER — FENTANYL CITRATE 0.05 MG/ML IJ SOLN
50.0000 ug | Freq: Once | INTRAMUSCULAR | Status: AC
  Administered 2014-06-09: 50 ug via INTRAVENOUS
  Filled 2014-06-09: qty 2

## 2014-06-09 NOTE — ED Provider Notes (Signed)
CSN: [id number]     Arrival date & time 06/09/14  1706 History   First MD Initiated Contact with Patient 06/09/14 1719     Chief Complaint  Patient presents with  . Migraine    Patient is a 48 y.o. female presenting with headaches. The history is provided by the patient and medical records.  Headache Pain location:  Frontal Quality:  Unable to specify Radiates to:  Does not radiate Severity currently:  10/10 Severity at highest:  10/10 Onset quality:  Gradual Duration:  4 hours Timing:  Constant Progression:  Worsening Chronicity:  New Similar to prior headaches: yes (except "worse pain")   Context: bright light   Relieved by:  None tried Worsened by:  Light Ineffective treatments:  None tried Associated symptoms: nausea   Associated symptoms: no abdominal pain and no cough   Nausea:    Severity:  Severe   Onset quality:  Gradual   Duration:  4 hours   Timing:  Constant   Progression:  Worsening Risk factors: sedentary lifestyle     Past Medical History  Diagnosis Date  . Cellulitis   . Asthma Dx2014  . Diabetes mellitus without complication 2005  . Renal disorder   . Stage 3 chronic kidney disease due to type 2 diabetes mellitus 2014   History reviewed. No pertinent past surgical history.  Family History  Problem Relation Age of Onset  . Diabetes Mother   . Cancer Mother 4264    uterine   . Heart disease Father 4150  . Cancer Maternal Grandmother     colon   History  Substance Use Topics  . Smoking status: Never Smoker   . Smokeless tobacco: Never Used  . Alcohol Use: No   OB History   Grav Para Term Preterm Abortions TAB SAB Ect Mult Living                 Review of Systems  Unable to perform ROS Respiratory: Negative for cough.   Cardiovascular: Positive for chest pain.  Gastrointestinal: Positive for nausea. Negative for abdominal pain.  Neurological: Positive for headaches.  All other systems reviewed and are negative.     Allergies  Bee  venom; Vicodin; and Benadryl  Home Medications   Prior to Admission medications   Medication Sig Start Date End Date Taking? Authorizing Provider  albuterol (PROVENTIL HFA;VENTOLIN HFA) 108 (90 BASE) MCG/ACT inhaler Inhale 2 puffs into the lungs every 6 (six) hours as needed for wheezing or shortness of breath. 05/28/14   Josalyn C Funches, MD  albuterol (PROVENTIL) (2.5 MG/3ML) 0.083% nebulizer solution Take 2.5 mg by nebulization every 6 (six) hours as needed for wheezing or shortness of breath.    Historical Provider, MD  hydrochlorothiazide (HYDRODIURIL) 25 MG tablet Take 1 tablet (25 mg total) by mouth daily. 05/28/14   Josalyn C Funches, MD  losartan (COZAAR) 50 MG tablet Take 1 tablet (50 mg total) by mouth daily. 05/28/14   Josalyn C Funches, MD  metFORMIN (GLUCOPHAGE-XR) 500 MG 24 hr tablet Take 2 tablets (1,000 mg total) by mouth daily. 05/28/14   Josalyn C Funches, MD  white petrolatum (VASELINE) GEL Apply 1 application topically as needed (2-3 times daily for venous stasis). 05/28/14   Josalyn C Funches, MD   BP 159/78  Pulse 81  Resp 18  SpO2 100%  LMP 05/25/2014  Physical Exam  Constitutional: She appears well-developed and well-nourished. She is cooperative. No distress.  HENT:  Head: Normocephalic and atraumatic.  Right  Ear: External ear normal.  Left Ear: External ear normal.  Mouth/Throat: Uvula is midline and oropharynx is clear and moist.  Eyes: Pupils are equal, round, and reactive to light.  Patient uncooperative with EOM testing - stating "I can't"; has photophobia with pupillary testing  Neck: Normal range of motion and phonation normal.  Cardiovascular: Normal rate and regular rhythm.   Pulmonary/Chest: Effort normal and breath sounds normal. No respiratory distress. She has no wheezes. She has no rales.  Abdominal: Soft. She exhibits no distension. There is no tenderness. There is no rebound and no guarding.  Musculoskeletal: Normal range of motion.   Neurological: She has normal strength.  Poor effort on bilateral upper extremity neuro testing, however grip strength equal bilaterally with equal strength noted on testing flexion and extension of the elbow; normal strength in bilateral lower extremities; face symmetric  Skin: Skin is warm and dry. No rash noted. She is not diaphoretic.    ED Course  Procedures  Labs Review  Results for orders placed during the hospital encounter of 06/09/14  CBC WITH DIFFERENTIAL      Result Value Ref Range   WBC 7.5  4.0 - 10.5 K/uL   RBC 4.15  3.87 - 5.11 MIL/uL   Hemoglobin 11.1 (*) 12.0 - 15.0 g/dL   HCT 16.1 (*) 09.6 - 04.5 %   MCV 81.2  78.0 - 100.0 fL   MCH 26.7  26.0 - 34.0 pg   MCHC 32.9  30.0 - 36.0 g/dL   RDW 40.9  81.1 - 91.4 %   Platelets 170  150 - 400 K/uL   Neutrophils Relative % 80 (*) 43 - 77 %   Neutro Abs 6.0  1.7 - 7.7 K/uL   Lymphocytes Relative 14  12 - 46 %   Lymphs Abs 1.0  0.7 - 4.0 K/uL   Monocytes Relative 4  3 - 12 %   Monocytes Absolute 0.3  0.1 - 1.0 K/uL   Eosinophils Relative 2  0 - 5 %   Eosinophils Absolute 0.1  0.0 - 0.7 K/uL   Basophils Relative 0  0 - 1 %   Basophils Absolute 0.0  0.0 - 0.1 K/uL  COMPREHENSIVE METABOLIC PANEL      Result Value Ref Range   Sodium 141  137 - 147 mEq/L   Potassium 4.6  3.7 - 5.3 mEq/L   Chloride 104  96 - 112 mEq/L   CO2 23  19 - 32 mEq/L   Glucose, Bld 93  70 - 99 mg/dL   BUN 34 (*) 6 - 23 mg/dL   Creatinine, Ser 7.82 (*) 0.50 - 1.10 mg/dL   Calcium 9.3  8.4 - 95.6 mg/dL   Total Protein 7.6  6.0 - 8.3 g/dL   Albumin 3.2 (*) 3.5 - 5.2 g/dL   AST 21  0 - 37 U/L   ALT 19  0 - 35 U/L   Alkaline Phosphatase 87  39 - 117 U/L   Total Bilirubin <0.2 (*) 0.3 - 1.2 mg/dL   GFR calc non Af Amer 39 (*) >90 mL/min   GFR calc Af Amer 45 (*) >90 mL/min   Anion gap 14  5 - 15  TROPONIN I      Result Value Ref Range   Troponin I <0.30  <0.30 ng/mL  PRO B NATRIURETIC PEPTIDE      Result Value Ref Range   Pro B Natriuretic  peptide (BNP) 555.7 (*) 0 - 125 pg/mL  URINE RAPID DRUG SCREEN (HOSP PERFORMED)      Result Value Ref Range   Opiates NONE DETECTED  NONE DETECTED   Cocaine NONE DETECTED  NONE DETECTED   Benzodiazepines NONE DETECTED  NONE DETECTED   Amphetamines NONE DETECTED  NONE DETECTED   Tetrahydrocannabinol NONE DETECTED  NONE DETECTED   Barbiturates NONE DETECTED  NONE DETECTED  URINALYSIS, ROUTINE W REFLEX MICROSCOPIC      Result Value Ref Range   Color, Urine YELLOW  YELLOW   APPearance CLOUDY (*) CLEAR   Specific Gravity, Urine 1.018  1.005 - 1.030   pH 7.0  5.0 - 8.0   Glucose, UA 100 (*) NEGATIVE mg/dL   Hgb urine dipstick SMALL (*) NEGATIVE   Bilirubin Urine NEGATIVE  NEGATIVE   Ketones, ur NEGATIVE  NEGATIVE mg/dL   Protein, ur >409>300 (*) NEGATIVE mg/dL   Urobilinogen, UA 0.2  0.0 - 1.0 mg/dL   Nitrite NEGATIVE  NEGATIVE   Leukocytes, UA NEGATIVE  NEGATIVE  URINE MICROSCOPIC-ADD ON      Result Value Ref Range   Squamous Epithelial / LPF FEW (*) RARE   WBC, UA 0-2  <3 WBC/hpf   RBC / HPF 3-6  <3 RBC/hpf   Bacteria, UA FEW (*) RARE  CBC      Result Value Ref Range   WBC 7.4  4.0 - 10.5 K/uL   RBC 3.67 (*) 3.87 - 5.11 MIL/uL   Hemoglobin 9.8 (*) 12.0 - 15.0 g/dL   HCT 81.130.4 (*) 91.436.0 - 78.246.0 %   MCV 82.8  78.0 - 100.0 fL   MCH 26.7  26.0 - 34.0 pg   MCHC 32.2  30.0 - 36.0 g/dL   RDW 95.613.7  21.311.5 - 08.615.5 %   Platelets 182  150 - 400 K/uL  GLUCOSE, CAPILLARY      Result Value Ref Range   Glucose-Capillary 156 (*) 70 - 99 mg/dL  CBG MONITORING, ED      Result Value Ref Range   Glucose-Capillary 88  70 - 99 mg/dL     Imaging Review Ct Head Wo Contrast  06/09/2014   CLINICAL DATA:  Severe headache with nausea and vomiting for 1 day.  EXAM: CT HEAD WITHOUT CONTRAST  TECHNIQUE: Contiguous axial images were obtained from the base of the skull through the vertex without intravenous contrast.  COMPARISON:  None.  FINDINGS: The brain appears normal without hemorrhage, infarct, mass  lesion, mass effect, midline shift or abnormal extra-axial fluid collection. No hydrocephalus or pneumocephalus. Mucous retention cyst or polyp right maxillary sinus is noted.  IMPRESSION: No acute abnormality.  Mucous retention cyst or polyp right maxillary sinus.   Electronically Signed   By: Drusilla Kannerhomas  Dalessio M.D.   On: 06/09/2014 18:31   Dg Chest Portable 1 View  06/09/2014   CLINICAL DATA:  Shortness of breath and chest pain. Initial encounter  EXAM: PORTABLE CHEST - 1 VIEW  COMPARISON:  02/03/2013  FINDINGS: Very low lung volumes. When accounting for interstitial crowding there is no suspected edema, pneumonia, effusion, or pneumothorax. Prominent heart size and upper mediastinal weight, possibly related to technique. No osseous findings to explain chest pain.  IMPRESSION: No edema or pneumonia suspected. Sensitivity limited by hypoaeration.   Electronically Signed   By: Tiburcio PeaJonathan  Watts M.D.   On: 06/09/2014 18:51    MDM   Final diagnoses:  Headache  Chest pain  Hypertensive urgency  Other migraine without status migrainosus, intractable   48 y.o. female with a  past medical history of diabetes, stage III kidney disease, morbid obesity, migraines and asthma. Presents to the ED due to a severe headache. Unable to specify whether it began suddenly or gradually however she states that it again while she was at work.  She started working at 2 PM and her headache began some time between 2 PM and 5 PM.  History of present illness, review of systems, exam as above.  Concern for subarachnoid hemorrhage. Also in the differential includes complex migraine, hypertensive emergency, hypertensive urgency.  Of note, EMS reports that she initially endorsed chest pain. The patient denies ever having chest pain to both myself and her RN.  Ct Head negative. Planned to perform an LP to exclude SAH. The patient refused an LP. She verbalized understanding that missing a diagnosis of SAH could result in death  or permanent disability. Given that her headache did not resolved with improvement of her BP, hypertensive emergency less likely.   She was given Reglan with minimal improvement in her pain. Given fentanyl with minimal improvement in her pain.  Labs were obtained and showed a CBC with a mild anemia, CMP reflected her CKD. Troponin undetectable. UA showed no infection. UDS negative. BNP mildly elevated at 555.  On reevaluation the patient continues to endorse headache. She has left upper extremity weakness on testing. She continues to have poor effort on neuro exam.  Medicine contacted for admission. She remained hemodynamically stable in the ED and was admitted to medicine.  This case was managed in conjunction with my attending, Dr. Rhunette Croft.  Maxine Glenn, MD 06/10/14 [phone]

## 2014-06-09 NOTE — ED Notes (Signed)
Refused rectal temp, Dr. Melene MullerBatista aware.

## 2014-06-09 NOTE — ED Notes (Signed)
Pt to ED via [facility] c/o chest pain, headache, and NV x 1 day. On EMS arrival, pt confused and unable to follow commands. Manual bp 208/120; HR 112; CBG 98. Pt given 324 ASA and nitro x 1. Pt has not been able to take blood pressure medications x 1 day. Pitting edema noted to bilateral extremities; blisters also noted. Pt denies cp at this time. Rates headache 10/10

## 2014-06-09 NOTE — H&P (Addendum)
Patient's PCP: Lora PaulaFUNCHES, JOSALYN C, MD  Chief Complaint: Altered mental status  History of Present Illness: Rose Heinrichngela Klooster is a 48 y.o. Caucasian female with history of chronic venous stasis changes in lower extremities bilaterally, asthma, diabetes, chronic kidney disease stage III due to diabetes, and migraine headaches who presents with the above complaints.  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 she had an episode of confusion where she reports that she had difficulty thinking, counting, and was not making sense when she was talking.  As a result EMS was called and she presented to the emergency department for further evaluation.  This EMS she had severe headache which is worse than her typical migraine headache.  She also had nausea and vomiting.  While in the emergency department, she reports that she has been having bilateral hand numbness which since being in the emergency department has improved.  During her episode today she had some chest pain and some shortness of breath which since then has improved.  In the emergency department she was offered lumbar puncture which patient declined after discussing risks and benefits.  However due to constellation of her symptoms which are still persistent, hospitalist service was asked to admit the patient for further care management.  She reports feeling feverish earlier today.  Reports having chest pain during her episode earlier today which since then has significantly improved.  Denies any abdominal pain, diarrhea, or vision changes.  Review of Systems: All systems reviewed with the patient and positive as per history of present illness, otherwise all other systems are negative.  Past Medical History  Diagnosis Date  . Cellulitis   . Asthma Dx2014  . Diabetes mellitus without complication 2005  . Renal disorder   . Stage 3 chronic kidney disease due to type 2 diabetes mellitus 2014  . Migraines    History  reviewed. No pertinent past surgical history. Family History  Problem Relation Age of Onset  . Diabetes Mother   . Cancer Mother 2864    uterine   . Heart disease Father 3650  . Cancer Maternal Grandmother     colon   History   Social History  . Marital Status: Married    Spouse Name: N/A    Number of Children: 1   . Years of Education: college    Occupational History  . Gas Station     2 days per [employer]    Social History Main Topics  . Smoking status: Never Smoker   . Smokeless tobacco: Never Used  . Alcohol Use: No  . Drug Use: No  . Sexual Activity: Not Currently   Other Topics Concern  . Not on file   Social History Narrative   Lives with Mr. Kandyce RudMcKirdy (husband, also my patient)   49 yo son lives 2 doors down.   3 grandchildren    Allergies: Levaquin; Bee venom; Lactose intolerance (gi); Penicillins; Vicodin; and Benadryl  Home Meds: Prior to Admission medications   Medication Sig Start Date End Date Taking? Authorizing Provider  albuterol (PROVENTIL HFA;VENTOLIN HFA) 108 (90 BASE) MCG/ACT inhaler Inhale 2 puffs into the lungs every 6 (six) hours as needed for wheezing or shortness of breath. 05/28/14  Yes Josalyn C Funches, MD  aspirin 81 MG tablet Take 162 mg by mouth once.   Yes Historical Provider, MD  benazepril (LOTENSIN) 5 MG tablet Take 5 mg by mouth once.   Yes Historical Provider, MD  glimepiride (AMARYL) 4 MG  tablet Take 4 mg by mouth once.   Yes Historical Provider, MD  hydrochlorothiazide (HYDRODIURIL) 25 MG tablet Take 25 mg by mouth at bedtime.   Yes Historical Provider, MD  losartan (COZAAR) 50 MG tablet Take 50 mg by mouth at bedtime.   Yes Historical Provider, MD  metFORMIN (GLUCOPHAGE-XR) 500 MG 24 hr tablet Take 500 mg by mouth at bedtime.   Yes Historical Provider, MD  naproxen sodium (ALEVE) 220 MG tablet Take 220 mg by mouth daily as needed (pain).   Yes Historical Provider, MD  white petrolatum (VASELINE) GEL Apply 1 application topically as  needed (2-3 times daily for venous stasis). 05/28/14  Yes Lora PaulaJosalyn C Funches, MD    Physical Exam: Blood pressure 190/91, pulse 95, temperature 98.2 F (36.8 C), temperature source Oral, resp. rate 12, last menstrual period 05/25/2014, SpO2 100.00%. General: Awake, Oriented x3, No acute distress. HEENT: EOMI, Moist mucous membranes Neck: Supple CV: S1 and S2 Lungs: Clear to ascultation bilaterally Abdomen: Soft, Nontender, Nondistended, +bowel sounds. Ext: Good pulses.  2+ lower extremity edema with bullous lesions bilaterally with some erythema, findings suggestive of venous stasis changes. No clubbing or cyanosis noted. Neuro: Cranial Nerves II-XII grossly intact. Has 5/5 motor strength in upper and lower extremities.  Lab results:  Recent Labs  06/09/14 1732  NA 141  K 4.6  CL 104  CO2 23  GLUCOSE 93  BUN 34*  CREATININE 1.52*  CALCIUM 9.3    Recent Labs  06/09/14 1732  AST 21  ALT 19  ALKPHOS 87  BILITOT <0.2*  PROT 7.6  ALBUMIN 3.2*   No results found for this basename: LIPASE, AMYLASE,  in the last 72 hours  Recent Labs  06/09/14 1732  WBC 7.5  NEUTROABS 6.0  HGB 11.1*  HCT 33.7*  MCV 81.2  PLT 170    Recent Labs  06/09/14 1732  TROPONINI <0.30   No components found with this basename: POCBNP,  No results found for this basename: DDIMER,  in the last 72 hours No results found for this basename: HGBA1C,  in the last 72 hours No results found for this basename: CHOL, HDL, LDLCALC, TRIG, CHOLHDL, LDLDIRECT,  in the last 72 hours No results found for this basename: TSH, T4TOTAL, FREET3, T3FREE, THYROIDAB,  in the last 72 hours No results found for this basename: VITAMINB12, FOLATE, FERRITIN, TIBC, IRON, RETICCTPCT,  in the last 72 hours Imaging results:  Ct Head Wo Contrast  06/09/2014   CLINICAL DATA:  Severe headache with nausea and vomiting for 1 day.  EXAM: CT HEAD WITHOUT CONTRAST  TECHNIQUE: Contiguous axial images were obtained from the base  of the skull through the vertex without intravenous contrast.  COMPARISON:  None.  FINDINGS: The brain appears normal without hemorrhage, infarct, mass lesion, mass effect, midline shift or abnormal extra-axial fluid collection. No hydrocephalus or pneumocephalus. Mucous retention cyst or polyp right maxillary sinus is noted.  IMPRESSION: No acute abnormality.  Mucous retention cyst or polyp right maxillary sinus.   Electronically Signed   By: Drusilla Kannerhomas  Dalessio M.D.   On: 06/09/2014 18:31   Dg Chest Portable 1 View  06/09/2014   CLINICAL DATA:  Shortness of breath and chest pain. Initial encounter  EXAM: PORTABLE CHEST - 1 VIEW  COMPARISON:  02/03/2013  FINDINGS: Very low lung volumes. When accounting for interstitial crowding there is no suspected edema, pneumonia, effusion, or pneumothorax. Prominent heart size and upper mediastinal weight, possibly related to technique. No osseous findings to  explain chest pain.  IMPRESSION: No edema or pneumonia suspected. Sensitivity limited by hypoaeration.   Electronically Signed   By: Tiburcio Pea M.D.   On: 06/09/2014 18:51   Other results: EKG: Sinus rhythm with heart rate of 90.  Assessment & Plan by Problem: Altered mental status/headache/bilateral hand numbness Unclear etiology.  Suspect is likely related to migraine headache versus TIA.  Will get an MRI for brain in the morning for further evaluation.  Head CT shows no acute abnormality.  Increase the dose of aspirin the patient has been taking to 325 mg daily.  Neuro checks.  Will request 2-D echocardiogram and carotid Dopplers.  If MRI of the brain is positive for any findings of CVA may consider neurology evaluation.  If MRI is negative and if patient has persistent symptoms consider neurology evaluation.  Of note, patient declined lumbar puncture in the emergency department, patient has no signs or symptoms suggestive of meningitis at this time.  Chest pain Unclear etiology.  Continue on telemetry.   Cycle cardiac enzymes.  2-D echocardiogram requested for above workup.  Continue to monitor.  If any further episodes, consider cardiology evaluation.  Chronic venous stasis changes in lower extremities bilaterally with dermatitis Elevate legs.  2-D echocardiogram pending as indicated above.  Will get lower extremity Dopplers to evaluate for DVT.  Chronic kidney disease stage III Renal function close to baseline.  Continue to monitor.  Anemia Likely due to chronic kidney disease stage III.  Continue to monitor.  Hemoglobin stable.  Hypertension Stable.  Continue home medications.  Not well controlled. PRN hydralazine for systolic blood pressure greater than 160.  Morbid obesity Given her symptoms of poor sleep quality and headaches, may consider outpatient sleep study to help determine the patient has obstructive sleep apnea.  Diabetes Holding metformin.  Sliding scale insulin.  Diabetic diet.  Prophylaxis Lovenox.  CODE STATUS Full code.  Disposition Admit the patient to telemetry as observation.  Time spent on admission, talking to the patient, and coordinating care was: 50 mins.  Joleah Kosak A, MD 06/09/2014, 11:40 PM

## 2014-06-10 ENCOUNTER — Observation Stay (HOSPITAL_COMMUNITY): Payer: MEDICAID

## 2014-06-10 DIAGNOSIS — I8311 Varicose veins of right lower extremity with inflammation: Secondary | ICD-10-CM

## 2014-06-10 DIAGNOSIS — G43109 Migraine with aura, not intractable, without status migrainosus: Secondary | ICD-10-CM | POA: Diagnosis present

## 2014-06-10 DIAGNOSIS — R519 Headache, unspecified: Secondary | ICD-10-CM | POA: Insufficient documentation

## 2014-06-10 DIAGNOSIS — G43909 Migraine, unspecified, not intractable, without status migrainosus: Secondary | ICD-10-CM

## 2014-06-10 DIAGNOSIS — I8312 Varicose veins of left lower extremity with inflammation: Secondary | ICD-10-CM

## 2014-06-10 DIAGNOSIS — I359 Nonrheumatic aortic valve disorder, unspecified: Secondary | ICD-10-CM

## 2014-06-10 DIAGNOSIS — G4489 Other headache syndrome: Secondary | ICD-10-CM

## 2014-06-10 DIAGNOSIS — R51 Headache: Secondary | ICD-10-CM

## 2014-06-10 DIAGNOSIS — N183 Chronic kidney disease, stage 3 (moderate): Secondary | ICD-10-CM

## 2014-06-10 DIAGNOSIS — R0789 Other chest pain: Secondary | ICD-10-CM | POA: Diagnosis present

## 2014-06-10 DIAGNOSIS — R079 Chest pain, unspecified: Secondary | ICD-10-CM

## 2014-06-10 LAB — BASIC METABOLIC PANEL
Anion gap: 12 (ref 5–15)
BUN: 32 mg/dL — AB (ref 6–23)
CHLORIDE: 105 meq/L (ref 96–112)
CO2: 22 mEq/L (ref 19–32)
Calcium: 8.6 mg/dL (ref 8.4–10.5)
Creatinine, Ser: 1.46 mg/dL — ABNORMAL HIGH (ref 0.50–1.10)
GFR calc Af Amer: 48 mL/min — ABNORMAL LOW (ref >90)
GFR calc non Af Amer: 41 mL/min — ABNORMAL LOW (ref >90)
GLUCOSE: 156 mg/dL — AB (ref 70–99)
Potassium: 4.2 mEq/L (ref 3.7–5.3)
Sodium: 139 mEq/L (ref 137–147)

## 2014-06-10 LAB — LIPID PANEL
Cholesterol: 221 mg/dL — ABNORMAL HIGH (ref 0–200)
HDL: 52 mg/dL (ref >39)
LDL Cholesterol: 114 mg/dL — ABNORMAL HIGH (ref 0–99)
TRIGLYCERIDES: 277 mg/dL — AB (ref <150)
Total CHOL/HDL Ratio: 4.3 RATIO
VLDL: 55 mg/dL — ABNORMAL HIGH (ref 0–40)

## 2014-06-10 LAB — TROPONIN I: Troponin I: <0.3 ng/mL (ref <0.30)

## 2014-06-10 LAB — CBC
HEMATOCRIT: 30.4 % — AB (ref 36.0–46.0)
HEMOGLOBIN: 9.8 g/dL — AB (ref 12.0–15.0)
MCH: 26.7 pg (ref 26.0–34.0)
MCHC: 32.2 g/dL (ref 30.0–36.0)
MCV: 82.8 fL (ref 78.0–100.0)
Platelets: 182 10*3/uL (ref 150–400)
RBC: 3.67 MIL/uL — ABNORMAL LOW (ref 3.87–5.11)
RDW: 13.7 % (ref 11.5–15.5)
WBC: 7.4 10*3/uL (ref 4.0–10.5)

## 2014-06-10 LAB — GLUCOSE, CAPILLARY
GLUCOSE-CAPILLARY: 156 mg/dL — AB (ref 70–99)
GLUCOSE-CAPILLARY: 148 mg/dL — AB (ref 70–99)
Glucose-Capillary: 88 mg/dL (ref 70–99)

## 2014-06-10 LAB — HEMOGLOBIN A1C
HEMOGLOBIN A1C: 6.4 % — AB (ref <5.7)
MEAN PLASMA GLUCOSE: 137 mg/dL — AB (ref <117)

## 2014-06-10 MED ORDER — BENAZEPRIL HCL 5 MG PO TABS
5.0000 mg | ORAL_TABLET | Freq: Once | ORAL | Status: AC
  Administered 2014-06-10: 5 mg via ORAL
  Filled 2014-06-10: qty 1

## 2014-06-10 MED ORDER — ASPIRIN EC 81 MG PO TBEC: 81.0000 mg | DELAYED_RELEASE_TABLET | Freq: Every day | ORAL | Status: DC

## 2014-06-10 MED ORDER — NORTRIPTYLINE HCL 25 MG PO CAPS
25.0000 mg | ORAL_CAPSULE | Freq: Every day | ORAL | Status: DC
  Administered 2014-06-10: 25 mg via ORAL
  Filled 2014-06-10: qty 1

## 2014-06-10 MED ORDER — STROKE: EARLY STAGES OF RECOVERY BOOK
Freq: Once | Status: AC
  Administered 2014-06-10: 01:00:00
  Filled 2014-06-10: qty 1

## 2014-06-10 MED ORDER — ASPIRIN 325 MG PO TABS
325.0000 mg | ORAL_TABLET | Freq: Every day | ORAL | Status: DC
  Administered 2014-06-10: 325 mg via ORAL
  Filled 2014-06-10: qty 1

## 2014-06-10 MED ORDER — ALBUTEROL SULFATE (2.5 MG/3ML) 0.083% IN NEBU: 3.0000 mL | INHALATION_SOLUTION | Freq: Four times a day (QID) | RESPIRATORY_TRACT | Status: DC | PRN

## 2014-06-10 MED ORDER — PROCHLORPERAZINE MALEATE 10 MG PO TABS
10.0000 mg | ORAL_TABLET | Freq: Every day | ORAL | Status: DC | PRN
  Administered 2014-06-10 – 2014-06-11 (×2): 10 mg via ORAL
  Filled 2014-06-10 (×3): qty 1

## 2014-06-10 MED ORDER — ENOXAPARIN SODIUM 80 MG/0.8ML ~~LOC~~ SOLN
75.0000 mg | Freq: Every day | SUBCUTANEOUS | Status: DC
  Filled 2014-06-10: qty 0.8

## 2014-06-10 MED ORDER — WHITE PETROLATUM GEL: 1.0000 "application " | Status: DC | PRN

## 2014-06-10 MED ORDER — FUROSEMIDE 40 MG PO TABS
40.0000 mg | ORAL_TABLET | Freq: Every day | ORAL | Status: DC
  Administered 2014-06-10 – 2014-06-11 (×2): 40 mg via ORAL
  Filled 2014-06-10 (×2): qty 1

## 2014-06-10 MED ORDER — ASPIRIN EC 81 MG PO TBEC
81.0000 mg | DELAYED_RELEASE_TABLET | Freq: Every day | ORAL | Status: DC
  Administered 2014-06-11: 81 mg via ORAL
  Filled 2014-06-10: qty 1

## 2014-06-10 MED ORDER — ENOXAPARIN SODIUM 40 MG/0.4ML ~~LOC~~ SOLN: 40.0000 mg | SUBCUTANEOUS | Status: DC

## 2014-06-10 MED ORDER — ACETAMINOPHEN 325 MG PO TABS
650.0000 mg | ORAL_TABLET | ORAL | Status: DC | PRN
  Administered 2014-06-10 (×2): 650 mg via ORAL
  Filled 2014-06-10 (×2): qty 2

## 2014-06-10 MED ORDER — HYDROCHLOROTHIAZIDE 25 MG PO TABS: 25.0000 mg | ORAL_TABLET | Freq: Every day | ORAL | Status: DC

## 2014-06-10 MED ORDER — GLIMEPIRIDE 4 MG PO TABS
4.0000 mg | ORAL_TABLET | Freq: Once | ORAL | Status: AC
  Administered 2014-06-10: 4 mg via ORAL
  Filled 2014-06-10: qty 1

## 2014-06-10 MED ORDER — METOCLOPRAMIDE HCL 5 MG/ML IJ SOLN
10.0000 mg | Freq: Once | INTRAMUSCULAR | Status: AC
  Administered 2014-06-10: 10 mg via INTRAVENOUS
  Filled 2014-06-10: qty 2

## 2014-06-10 MED ORDER — INSULIN ASPART 100 UNIT/ML ~~LOC~~ SOLN: 0.0000 [IU] | Freq: Three times a day (TID) | SUBCUTANEOUS | Status: DC

## 2014-06-10 MED ORDER — TRAMADOL HCL 50 MG PO TABS
50.0000 mg | ORAL_TABLET | Freq: Four times a day (QID) | ORAL | Status: DC | PRN
  Administered 2014-06-10: 50 mg via ORAL
  Filled 2014-06-10: qty 1

## 2014-06-10 MED ORDER — CARVEDILOL 3.125 MG PO TABS
3.1250 mg | ORAL_TABLET | Freq: Two times a day (BID) | ORAL | Status: DC
  Administered 2014-06-10 – 2014-06-11 (×2): 3.125 mg via ORAL
  Filled 2014-06-10 (×2): qty 1

## 2014-06-10 MED ORDER — INSULIN ASPART 100 UNIT/ML ~~LOC~~ SOLN
0.0000 [IU] | Freq: Every day | SUBCUTANEOUS | Status: DC
  Administered 2014-06-10: 1 [IU] via SUBCUTANEOUS

## 2014-06-10 MED ORDER — LOSARTAN POTASSIUM 50 MG PO TABS
50.0000 mg | ORAL_TABLET | Freq: Every day | ORAL | Status: DC
  Administered 2014-06-10: 50 mg via ORAL
  Filled 2014-06-10: qty 1

## 2014-06-10 NOTE — ED Provider Notes (Signed)
I saw and evaluated the patient, reviewed the resident's note and I agree with the findings and plan.   EKG Interpretation None      Date: 06/10/2014  Rate: 90  Rhythm: normal sinus rhythm  QRS Axis: normal  Intervals: normal  ST/T Wave abnormalities: normal  Conduction Disutrbances: none  Narrative Interpretation: unremarkable      Pt comes in with cc of headaches, AMS. Diabetic, morbidly obese. Not cooperative with hx and exam Reportedly was disoriented while at work, stating bacon instead of bag. Hx of migraines, but states that the current headaches, which started after 2 pm are worse, and she has L arm and hand numbness and LUE weakness. No objective neuro deficits.  CT head is neg, and was within 6 hours window of onset of pain - so very sensitive. However, we wanted to do LP for a proper rule out, and she declines.  Patient refusing lumbar puncture, against medical advise. Patient understands that his/her actions will lead to inadequate medical workup, and that he/she is at risk of complications of missed diagnosis, which includes morbidity and mortality. In this case, missing SAH can be deadly.  Patient is demonstrating good capacity to make decision. Patient understands that he/she needs to return to the ER immediately if his/her symptoms get worse.   There is no fam hx of SAH, brain tumors, bleeds or aneurysms.  Will admit for observation for headache, and possible TIA.   Derwood KaplanAnkit Mouhamad Teed, MD 06/10/14 [phone]

## 2014-06-10 NOTE — Consult Note (Signed)
Neurology Consultation Reason for Consult: Headache/tingling Referring Physician: Hongalgi, A  CC: Tingling  History is obtained from: Patient  HPI: Rose Murray is a 48 y.o. female with a history of migraines who presents with an episode that occurred yesterday where she was at work and began having tingling of the left side of her face which spread down her left shoulder to her left arm. At the same time, she began having tingling of her right hand and began making speech errors. She describes that when she went asked "do you want a bag?", she actually asked "do you want a bacon?".   Due to these symptoms, [facility]. She still has a mild headache, though improved from yesterday. She describes it as bifrontal throbbing in character, associated with nausea and photophobia. She has had many similar episodes in the past, including tingling of her arms but this episode was worse than typical and she has not had the difficulty speaking before.  She is getting headaches more frequently than she has in the past, sometimes getting them once a week.  LKW: 10/25 tpa given?: no, not a stroke   ROS: A 14 point ROS was performed and is negative except as noted in the HPI.   Past Medical History  Diagnosis Date  . Cellulitis   . Asthma Dx2014  . Diabetes mellitus without complication 2005  . Renal disorder   . Stage 3 chronic kidney disease due to type 2 diabetes mellitus 2014  . Migraines     Family History: No history of migraines  Social History: Tob: Denies  Exam: Current vital signs: BP 160/70  Pulse 88  Temp(Src) 98.4 F (36.9 C) (Oral)  Resp 18  Ht 5\' 8"  (1.727 m)  Wt 149.778 kg (330 lb 3.2 oz)  BMI 50.22 kg/m2  SpO2 99%  LMP 05/25/2014 Vital signs in last 24 hours: Temp:  [98.1 F (36.7 C)-99.1 F (37.3 C)] 98.4 F (36.9 C) (10/26 1141) Pulse Rate:  [81-107] 88 (10/26 1141) Resp:  [12-25] 18 (10/26 1141) BP: (124-198)/(70-104) 160/70 mmHg  (10/26 1141) SpO2:  [99 %-100 %] 99 % (10/26 1141) Weight:  [149.778 kg (330 lb 3.2 oz)] 149.778 kg (330 lb 3.2 oz) (10/26 0100)  General: In bed, NAD CV: Regular rate and rhythm Mental Status: Patient is awake, alert, oriented to person, place, month, year, and situation. Immediate and remote memory are intact. Patient is able to give a clear and coherent history. No signs of aphasia or neglect Cranial Nerves: II: Visual Fields are full. Pupils are equal, round, and reactive to light.  Discs are difficult to visualize. III,IV, VI: EOMI without ptosis or diploplia.  V: Facial sensation is symmetric to temperature VII: Facial movement is symmetric.  VIII: hearing is intact to voice X: Uvula elevates symmetrically XI: Shoulder shrug is symmetric. XII: tongue is midline without atrophy or fasciculations.  Motor: Tone is normal. Bulk is normal. 5/5 strength was present on the right side, on the left she has mild 4+/5 weakness in the left arm with no pronator drift. Sensory: Sensation is "odd" on the left arm. Otherwise intact to light touch Deep Tendon Reflexes: 2+ and symmetric in the biceps and patellae.  Cerebellar: FNF is intact bilaterally. Gait: Not tested due to patient discomfort   I have reviewed labs in epic and the results pertinent to this consultation are: Mildly elevated creatinine  I have reviewed the images obtained: MRI brain-no acute infarct  Impression: 48 year old  female with recurrent episode of tingling in the setting of headache. The similar previous episodes with migraine, bilateral location, spread from initial starting point, positive symptoms are all strongly suggestive of a nonvascular nature. I suspect this does represent complicated migraine.  Recommendations: 1) nortriptyline 25 mg daily at bedtime for migraine prevention 2) Compazine 10 mg by mouth when necessary for acute headache 3) I have ordered Reglan 10 mg IV 1 for current  headache.   Ritta SlotMcNeill Latron Ribas, MD [facility] [phone]  If 7pm- 7am, please page neurology on call as listed in AMION.

## 2014-06-10 NOTE — Progress Notes (Signed)
PHARMACY ANTICOAGULATION NOTE  RE: Lovenox Indication: VTE prophylaxis  Allergies  Allergen Reactions  . Levaquin [Levofloxacin In D5w] Other (See Comments)    Cause facial paralysis and throat started to swell  . Bee Venom Hives  . Lactose Intolerance (Gi) Other (See Comments)    Gi upset  . Penicillins Nausea And Vomiting  . Vicodin [Hydrocodone-Acetaminophen] Nausea And Vomiting  . Benadryl [Diphenhydramine Hcl] Rash    Patient Measurements: Height: 5\' 8"  (172.7 cm) Weight: 330 lb 3.2 oz (149.778 kg) IBW/kg (Calculated) : 63.9  Vital Signs: Temp: 98.1 F (36.7 C) (10/26 0751) Temp Source: Oral (10/26 0751) BP: 171/85 mmHg (10/26 0751) Pulse Rate: 87 (10/26 0751)  Labs:  Recent Labs  06/09/14 1732 06/10/14 0117 06/10/14 0730  HGB 11.1* 9.8*  --   HCT 33.7* 30.4*  --   PLT 170 182  --   CREATININE 1.52* 1.46*  --   TROPONINI <0.30 <0.30 <0.30    Estimated Creatinine Clearance: 72.3 ml/min (by C-G formula based on Cr of 1.46).   Medical History: Past Medical History  Diagnosis Date  . Cellulitis   . Asthma Dx2014  . Diabetes mellitus without complication 2005  . Renal disorder   . Stage 3 chronic kidney disease due to type 2 diabetes mellitus 2014  . Migraines     Medications:  Scheduled:  . aspirin  325 mg Oral Daily  . enoxaparin (LOVENOX) injection  40 mg Subcutaneous Q24H  . hydrochlorothiazide  25 mg Oral QHS  . insulin aspart  0-5 Units Subcutaneous QHS  . insulin aspart  0-9 Units Subcutaneous TID WC  . losartan  50 mg Oral QHS    Assessment: 48 yo F with admitted following an episode of confusion.  It is suspected that patient's episode of AMS and headache are related to migraine.  MRI negative for CVA.  Pt started on Lovenox for VTE prophylaxis.  Based on patient's weight, will increase dose to 0.5 mg/kg/day.  Goal of Therapy:  Heparin level 0.3-0.6 units/ml Monitor platelets by anticoagulation protocol: Yes   Plan:  Increase  Lovenox to 75 mg SQ q24h. Monitor for signs and symptoms of bleeding.  Toys 'R' UsKimberly Tejah Brekke, Pharm.D., BCPS Clinical Pharmacist Pager [phone] 06/10/2014 10:44 AM

## 2014-06-10 NOTE — Progress Notes (Signed)
Echocardiogram 2D Echocardiogram has been performed.  Dorothey BasemanReel, Tonyia Marschall M 06/10/2014, 2:12 PM

## 2014-06-10 NOTE — Progress Notes (Signed)
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 Her husband noticed that she abruptly started talking nonsensical-instead of asking a customer "do you want to bag", she kept repeatedly asking "do you want bacon". She also had global headache, nausea and tingling of both upper extremities with some photophobia similar to prior episodes of migraine. However her headache was worse than the usual. She briefly complained of chest pain and dyspnea which resolved spontaneously.   Assessment/Plan:  1. Complicated migraine: CTA and MRI brain without acute findings. Neurology was consulted and agree and have made medication recommendations. 2. Chest pain: Nonspecific. Resolved. Troponin 3: Negative. Follow 2-D echo results. 3. Bilateral lower extremity chronic venous stasis: Diuretics, elevation of feet (unable to do secondary to her current job) and apparently cannot tolerate compression stockings. 4. Stage III chronic kidney disease: Creatinine close to baseline. 5. Anemia of chronic disease: Follow CBCs 6. Essential hypertension: Mildly uncontrolled. DC HCTZ. Continue ARB. May have to add a second agent. 7. Morbid obesity 8. Type II DM with renal complications: Continue SSI.   Code Status: Full Family Communication: None at bedside Disposition Plan: Possible discharge home 10/27   Consultants:  Neurology  Procedures:  None  Antibiotics:  None   Subjective: Headache has improved-mild. No photophobia or nausea. No tingling or numbness of extremities. Denies weakness. Denies confusion.  Objective: Filed Vitals:   06/10/14 0100 06/10/14 0400 06/10/14  0751 06/10/14 1141  BP: 159/71 124/70 171/85 160/70  Pulse: 98 91 87 88  Temp: 98.9 F (37.2 C) 98.7 F (37.1 C) 98.1 F (36.7 C) 98.4 F (36.9 C)  TempSrc: Oral Oral Oral Oral  Resp: 18 18 18 18   Height: 5\' 8"  (1.727 m)     Weight: 149.778 kg (330 lb 3.2 oz)     SpO2: 100% 100% 100% 99%    Intake/Output Summary (Last 24 hours) at 06/10/14 1611 Last data filed at 06/09/14 2026  Gross per 24 hour  Intake      0 ml  Output    400 ml  Net   -400 ml   Filed Weights   06/10/14 0100  Weight: 149.778 kg (330 lb 3.2 oz)     Exam:  General exam: Morbidly obese female lying comfortably supine in bed. Respiratory system: Clear. No increased work of breathing. Cardiovascular system: S1 & S2 heard, RRR. No JVD, murmurs, gallops, clicks or pedal edema. Telemetry: Sinus rhythm 90-sinus tachycardia in the 100s. Gastrointestinal system: Abdomen is nondistended, soft and nontender. Normal bowel sounds heard. Central nervous system: Alert and oriented. No focal neurological deficits. Extremities: Symmetric 5 x 5 power. Chronic venous stasis changes in bilateral legs without acute findings.   Data Reviewed: Basic Metabolic Panel:  Recent Labs Lab 06/09/14 1732 06/10/14 0117  NA 141 139  K 4.6 4.2  CL 104 105  CO2 23 22  GLUCOSE 93 156*  BUN 34* 32*  CREATININE 1.52* 1.46*  CALCIUM 9.3 8.6   Liver Function Tests:  Recent Labs Lab 06/09/14 1732  AST 21  ALT 19  ALKPHOS 87  BILITOT <0.2*  PROT 7.6  ALBUMIN 3.2*   No results found for this basename: LIPASE, AMYLASE,  in the last 168  hours No results found for this basename: AMMONIA,  in the last 168 hours CBC:  Recent Labs Lab 06/09/14 1732 06/10/14 0117  WBC 7.5 7.4  NEUTROABS 6.0  --   HGB 11.1* 9.8*  HCT 33.7* 30.4*  MCV 81.2 82.8  PLT 170 182   Cardiac Enzymes:  Recent Labs Lab 06/09/14 1732 06/10/14 0117 06/10/14 0730  TROPONINI <0.30 <0.30 <0.30   BNP (last 3 results)  Recent Labs   06/09/14 1839  PROBNP 555.7*   CBG:  Recent Labs Lab 06/09/14 1808 06/10/14 0129 06/10/14 0754  GLUCAP 88 156* 88    No results found for this or any previous visit (from the past 240 hour(s)).     Studies: Ct Head Wo Contrast  06/09/2014   CLINICAL DATA:  Severe headache with nausea and vomiting for 1 day.  EXAM: CT HEAD WITHOUT CONTRAST  TECHNIQUE: Contiguous axial images were obtained from the base of the skull through the vertex without intravenous contrast.  COMPARISON:  None.  FINDINGS: The brain appears normal without hemorrhage, infarct, mass lesion, mass effect, midline shift or abnormal extra-axial fluid collection. No hydrocephalus or pneumocephalus. Mucous retention cyst or polyp right maxillary sinus is noted.  IMPRESSION: No acute abnormality.  Mucous retention cyst or polyp right maxillary sinus.   Electronically Signed   By: Drusilla Kannerhomas  Dalessio M.D.   On: 06/09/2014 18:31   Mri Brain Without Contrast  06/10/2014   CLINICAL DATA:  Altered mental status. Diabetes mellitus. Chronic renal disease. Migraines.  EXAM: MRI HEAD WITHOUT CONTRAST  MRA HEAD WITHOUT CONTRAST  TECHNIQUE: Multiplanar, multiecho pulse sequences of the brain and surrounding structures were obtained without intravenous contrast. Angiographic images of the head were obtained using MRA technique without contrast.  COMPARISON:  CT head 06/09/2014.  FINDINGS: MRI HEAD FINDINGS  No evidence for acute infarction, hemorrhage, mass lesion, hydrocephalus, or extra-axial fluid. Normal for age cerebral volume. Mild periventricular greater than subcortical T2 and FLAIR hyperintensities, likely chronic microvascular ischemic change. Flow voids are maintained throughout the carotid, basilar, and vertebral arteries. There are no areas of chronic hemorrhage. Pituitary, pineal, and cerebellar tonsils unremarkable. No upper cervical lesions. Visualized calvarium, skull base, and upper cervical osseous structures unremarkable.  Scalp and extracranial soft tissues, orbits, and sinuses show no acute process. Trace BILATERAL mastoid effusions. Compared with prior CT, the appearance is similar.  MRA HEAD FINDINGS  The internal carotid arteries are widely patent. The basilar artery is widely patent with both vertebrals contributing. There is no intracranial stenosis or visible aneurysm.  IMPRESSION: No acute intracranial findings. Slight T2 and FLAIR white matter hyperintensities, likely chronic microvascular ischemic change.  Unremarkable MRA intracranial circulation.   Electronically Signed   By: Davonna BellingJohn  Curnes M.D.   On: 06/10/2014 11:33   Dg Chest Portable 1 View  06/09/2014   CLINICAL DATA:  Shortness of breath and chest pain. Initial encounter  EXAM: PORTABLE CHEST - 1 VIEW  COMPARISON:  02/03/2013  FINDINGS: Very low lung volumes. When accounting for interstitial crowding there is no suspected edema, pneumonia, effusion, or pneumothorax. Prominent heart size and upper mediastinal weight, possibly related to technique. No osseous findings to explain chest pain.  IMPRESSION: No edema or pneumonia suspected. Sensitivity limited by hypoaeration.   Electronically Signed   By: Tiburcio PeaJonathan  Watts M.D.   On: 06/09/2014 18:51   Mr Maxine GlennMra Head/brain Wo Cm  06/10/2014   CLINICAL DATA:  Altered mental status. Diabetes mellitus. Chronic renal disease. Migraines.  EXAM: MRI HEAD WITHOUT  CONTRAST  MRA HEAD WITHOUT CONTRAST  TECHNIQUE: Multiplanar, multiecho pulse sequences of the brain and surrounding structures were obtained without intravenous contrast. Angiographic images of the head were obtained using MRA technique without contrast.  COMPARISON:  CT head 06/09/2014.  FINDINGS: MRI HEAD FINDINGS  No evidence for acute infarction, hemorrhage, mass lesion, hydrocephalus, or extra-axial fluid. Normal for age cerebral volume. Mild periventricular greater than subcortical T2 and FLAIR hyperintensities, likely chronic microvascular ischemic change. Flow  voids are maintained throughout the carotid, basilar, and vertebral arteries. There are no areas of chronic hemorrhage. Pituitary, pineal, and cerebellar tonsils unremarkable. No upper cervical lesions. Visualized calvarium, skull base, and upper cervical osseous structures unremarkable. Scalp and extracranial soft tissues, orbits, and sinuses show no acute process. Trace BILATERAL mastoid effusions. Compared with prior CT, the appearance is similar.  MRA HEAD FINDINGS  The internal carotid arteries are widely patent. The basilar artery is widely patent with both vertebrals contributing. There is no intracranial stenosis or visible aneurysm.  IMPRESSION: No acute intracranial findings. Slight T2 and FLAIR white matter hyperintensities, likely chronic microvascular ischemic change.  Unremarkable MRA intracranial circulation.   Electronically Signed   By: Davonna BellingJohn  Curnes M.D.   On: 06/10/2014 11:33        Scheduled Meds: . aspirin  325 mg Oral Daily  . enoxaparin (LOVENOX) injection  75 mg Subcutaneous Daily  . hydrochlorothiazide  25 mg Oral QHS  . insulin aspart  0-5 Units Subcutaneous QHS  . insulin aspart  0-9 Units Subcutaneous TID WC  . losartan  50 mg Oral QHS   Continuous Infusions:   Principal Problem:   Acute encephalopathy Active Problems:   DM2 (diabetes mellitus, type 2)   HTN (hypertension)   Venous stasis dermatitis of both lower extremities   Anemia   CKD (chronic kidney disease) stage 3, GFR 30-59 ml/min   Asthma, chronic   Headache, acute   Altered mental status   Morbid obesity   Bilateral hand numbness   Chest pain    Time spent: 30 minutes.    Marcellus ScottHONGALGI,Suzane Vanderweide, MD, FACP, FHM. [facility] Pager [phone]  If 7PM-7AM, please contact night-coverage [email or web address] Password [email or web address] 06/10/2014, 4:11 PM    LOS: 1 day

## 2014-06-10 NOTE — Progress Notes (Signed)
UR completed 

## 2014-06-11 DIAGNOSIS — M7989 Other specified soft tissue disorders: Secondary | ICD-10-CM

## 2014-06-11 DIAGNOSIS — G458 Other transient cerebral ischemic attacks and related syndromes: Secondary | ICD-10-CM

## 2014-06-11 DIAGNOSIS — R0789 Other chest pain: Secondary | ICD-10-CM

## 2014-06-11 DIAGNOSIS — N189 Chronic kidney disease, unspecified: Secondary | ICD-10-CM

## 2014-06-11 DIAGNOSIS — E1122 Type 2 diabetes mellitus with diabetic chronic kidney disease: Secondary | ICD-10-CM

## 2014-06-11 LAB — CBC
HCT: 28.3 % — ABNORMAL LOW (ref 36.0–46.0)
HEMOGLOBIN: 9.3 g/dL — AB (ref 12.0–15.0)
MCH: 26.9 pg (ref 26.0–34.0)
MCHC: 32.9 g/dL (ref 30.0–36.0)
MCV: 81.8 fL (ref 78.0–100.0)
PLATELETS: 150 10*3/uL (ref 150–400)
RBC: 3.46 MIL/uL — AB (ref 3.87–5.11)
RDW: 13.7 % (ref 11.5–15.5)
WBC: 4.5 10*3/uL (ref 4.0–10.5)

## 2014-06-11 LAB — BASIC METABOLIC PANEL
ANION GAP: 9 (ref 5–15)
BUN: 32 mg/dL — ABNORMAL HIGH (ref 6–23)
CHLORIDE: 105 meq/L (ref 96–112)
CO2: 25 mEq/L (ref 19–32)
Calcium: 8.5 mg/dL (ref 8.4–10.5)
Creatinine, Ser: 1.39 mg/dL — ABNORMAL HIGH (ref 0.50–1.10)
GFR, EST AFRICAN AMERICAN: 51 mL/min — AB (ref >90)
GFR, EST NON AFRICAN AMERICAN: 44 mL/min — AB (ref >90)
Glucose, Bld: 112 mg/dL — ABNORMAL HIGH (ref 70–99)
POTASSIUM: 4.6 meq/L (ref 3.7–5.3)
SODIUM: 139 meq/L (ref 137–147)

## 2014-06-11 MED ORDER — PROCHLORPERAZINE MALEATE 10 MG PO TABS: 10.0000 mg | ORAL_TABLET | Freq: Every day | ORAL | Status: DC | PRN

## 2014-06-11 MED ORDER — ACETAMINOPHEN 325 MG PO TABS: 650.0000 mg | ORAL_TABLET | Freq: Four times a day (QID) | ORAL | Status: AC | PRN

## 2014-06-11 MED ORDER — NORTRIPTYLINE HCL 25 MG PO CAPS: 25.0000 mg | ORAL_CAPSULE | Freq: Every day | ORAL | Status: DC

## 2014-06-11 MED ORDER — CARVEDILOL 6.25 MG PO TABS: 6.2500 mg | ORAL_TABLET | Freq: Two times a day (BID) | ORAL | Status: DC

## 2014-06-11 MED ORDER — FUROSEMIDE 20 MG PO TABS: 20.0000 mg | ORAL_TABLET | Freq: Every day | ORAL | Status: DC

## 2014-06-11 NOTE — Progress Notes (Signed)
VASCULAR LAB PRELIMINARY  PRELIMINARY  PRELIMINARY  PRELIMINARY  Bilateral lower extremity venous duplex and carotid duplex completed.    Preliminary report:   1.  Venous:  Bilateral:  No obvious evidence of DVT, superficial thrombosis, or Baker's Cyst.  Limited by pain, edema.    2.  Carotid:  Bilateral:  1-39% ICA stenosis.  Vertebral artery flow is antegrade.     Rose Murray, RVT 06/11/2014, 11:51 AM

## 2014-06-11 NOTE — Discharge Summary (Signed)
Physician Discharge Summary  Rose Murray WUJ:[id number] DOB: Sep 07, 1965 DOA: 06/09/2014  PCP: Lora Paula, MD  Admit date: 06/09/2014 Discharge date: 06/11/2014  Time spent: Less than 30 minutes  Recommendations for Outpatient Follow-up:  1. Dr. Dessa Phi, PCP in 1 week with repeat labs (CBC & BMP). Patient is advised to take a log of her home CBG checks for evaluation of DM management.  Discharge Diagnoses:  Principal Problem:   Complicated migraine Active Problems:   DM2 (diabetes mellitus, type 2)   HTN (hypertension)   Venous stasis dermatitis of both lower extremities   Anemia   CKD (chronic kidney disease) stage 3, GFR 30-59 ml/min   Asthma, chronic   Headache, acute   Acute encephalopathy   Altered mental status   Morbid obesity   Bilateral hand numbness   Chest pain   Atypical chest pain   Discharge Condition: Improved & Stable  Diet recommendation: Heart healthy and diabetic diet.  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 Her husband noticed that she abruptly started talking nonsensical-instead of asking a customer "do you want to bag", she kept repeatedly asking "do you want bacon". She also had global headache, nausea and tingling of both upper extremities with some photophobia similar to prior episodes of migraine. However her headache was worse than the usual. She denies ever having chest pain during current illness but had some brief dyspnea which resolved spontaneously.  Hospital Course:   1. Complicated migraine: CTA and MRI brain without acute findings. Neurology was consulted and agree and  have made medication recommendations. Headache resolved. Continue nortriptyline at bedtime for prophylaxis and when necessary Compazine for headaches. Discussed with neurology today who have cleared her for discharge 2. ???Chest pain: Nonspecific. Resolved. Troponin 3: Negative. 2-D echo: LVEF normal. Patient actually indicates that she never had chest pains. She had brief dyspnea which resolved spontaneously prior to coming to the hospital. 3. Bilateral lower extremity chronic venous stasis: Diuretics, elevation of feet (unable to do secondary to her current job) and apparently cannot tolerate compression stockings. Discontinued HCTZ secondary to stage III chronic kidney disease and started low-dose Lasix. Diuresing well. Lower extremity venous Dopplers negative for DVT. 4. Stage III chronic kidney disease: Creatinine close to baseline. Close outpatient follow-up and may consider nephrology consultation. 5. Anemia of chronic disease: Stable 6. Essential hypertension: Mildly uncontrolled. DC HCTZ. Continue ARB at prior home dose. Started carvedilol and will increase to 6.25 MG twice a day at discharge. Outpatient close monitoring and medication adjustment as needed.  7. Morbid obesity 8. Type II DM with renal complications: Patient has been taken off Amaryl recently. Discontinue metformin secondary to stage III chronic kidney disease. Patient states that her home CBG checks are not very high and usually in the 80s. She is advised to check her CBGs 3 times a day before meals and at bedtime, maintain a log and carry during her PCP visit for further evaluation. Patient has declined consistent CBG checks and SSI in the hospital. Of what was done, CBGs ranged between 88-156 mg. At this time will discharge patient without oral hypoglycemics. Closely review during outpatient follow-up  and if CBGs persistently high then may consider low-dose Glucotrol 2.5 MG  daily.  Consultations:  Neurology  Procedures:  None    Discharge Exam:  Complaints:  No further headache.  Filed Vitals:   06/11/14 0024 06/11/14 0543 06/11/14 0730 06/11/14 1039  BP: 171/83 167/68 175/76 159/79  Pulse: 75 75 77 71  Temp: 98.1 F (36.7 C) 98.2 F (36.8 C) 98 F (36.7 C)   TempSrc: Oral Oral Oral   Resp:      Height:      Weight:  147.464 kg (325 lb 1.6 oz)    SpO2: 99% 99% 100%    General exam: Morbidly obese female lying comfortably supine in bed.  Respiratory system: Clear. No increased work of breathing.  Cardiovascular system: S1 & S2 heard, RRR. No JVD, murmurs, gallops, clicks or pedal edema. Telemetry: Sinus rhythm.  Gastrointestinal system: Abdomen is nondistended, soft and nontender. Normal bowel sounds heard.  Central nervous system: Alert and oriented. No focal neurological deficits.  Extremities: Symmetric 5 x 5 power. Chronic venous stasis changes in bilateral legs without acute findings.  Discharge Instructions      Discharge Instructions   Call MD for:  severe uncontrolled pain    Complete by:  As directed      Diet - low sodium heart healthy    Complete by:  As directed      Diet Carb Modified    Complete by:  As directed      Increase activity slowly    Complete by:  As directed             Medication List    STOP taking these medications       ALEVE 220 MG tablet  Generic drug:  naproxen sodium     aspirin 81 MG tablet     benazepril 5 MG tablet  Commonly known as:  LOTENSIN     glimepiride 4 MG tablet  Commonly known as:  AMARYL     hydrochlorothiazide 25 MG tablet  Commonly known as:  HYDRODIURIL     metFORMIN 500 MG 24 hr tablet  Commonly known as:  GLUCOPHAGE-XR      TAKE these medications       acetaminophen 325 MG tablet  Commonly known as:  TYLENOL  Take 2 tablets (650 mg total) by mouth every 6 (six) hours as needed for mild pain or moderate pain (temperature >/= 99.5 F).     albuterol 108  (90 BASE) MCG/ACT inhaler  Commonly known as:  PROVENTIL HFA;VENTOLIN HFA  Inhale 2 puffs into the lungs every 6 (six) hours as needed for wheezing or shortness of breath.     carvedilol 6.25 MG tablet  Commonly known as:  COREG  Take 1 tablet (6.25 mg total) by mouth 2 (two) times daily with a meal.     furosemide 20 MG tablet  Commonly known as:  LASIX  Take 1 tablet (20 mg total) by mouth daily.     losartan 50 MG tablet  Commonly known as:  COZAAR  Take 50 mg by mouth at bedtime.     nortriptyline 25 MG capsule  Commonly known as:  PAMELOR  Take 1 capsule (25 mg total) by mouth at bedtime.     prochlorperazine 10 MG tablet  Commonly known as:  COMPAZINE  Take 1 tablet (10 mg total) by mouth daily as needed (Migraine headache.).     white petrolatum Gel  Commonly known as:  VASELINE  Apply 1 application topically as needed (2-3 times daily for venous stasis).       Follow-up Information   Follow up with Lora Paula, MD. Schedule an appointment as soon as possible for a visit in 1 week. (To be seen with repeat labs (CBC & BMP).)    Specialty:  Family Medicine   Contact information:   [address] [place] [address] [phone]        The results of significant diagnostics from this hospitalization (including imaging, microbiology, ancillary and laboratory) are listed below for reference.    Significant Diagnostic Studies: Ct Head Wo Contrast  06/09/2014   CLINICAL DATA:  Severe headache with nausea and vomiting for 1 day.  EXAM: CT HEAD WITHOUT CONTRAST  TECHNIQUE: Contiguous axial images were obtained from the base of the skull through the vertex without intravenous contrast.  COMPARISON:  None.  FINDINGS: The brain appears normal without hemorrhage, infarct, mass lesion, mass effect, midline shift or abnormal extra-axial fluid collection. No hydrocephalus or pneumocephalus. Mucous retention cyst or polyp right maxillary sinus is noted.   IMPRESSION: No acute abnormality.  Mucous retention cyst or polyp right maxillary sinus.   Electronically Signed   By: Drusilla Kanner M.D.   On: 06/09/2014 18:31   Mri Brain Without Contrast  06/10/2014   CLINICAL DATA:  Altered mental status. Diabetes mellitus. Chronic renal disease. Migraines.  EXAM: MRI HEAD WITHOUT CONTRAST  MRA HEAD WITHOUT CONTRAST  TECHNIQUE: Multiplanar, multiecho pulse sequences of the brain and surrounding structures were obtained without intravenous contrast. Angiographic images of the head were obtained using MRA technique without contrast.  COMPARISON:  CT head 06/09/2014.  FINDINGS: MRI HEAD FINDINGS  No evidence for acute infarction, hemorrhage, mass lesion, hydrocephalus, or extra-axial fluid. Normal for age cerebral volume. Mild periventricular greater than subcortical T2 and FLAIR hyperintensities, likely chronic microvascular ischemic change. Flow voids are maintained throughout the carotid, basilar, and vertebral arteries. There are no areas of chronic hemorrhage. Pituitary, pineal, and cerebellar tonsils unremarkable. No upper cervical lesions. Visualized calvarium, skull base, and upper cervical osseous structures unremarkable. Scalp and extracranial soft tissues, orbits, and sinuses show no acute process. Trace BILATERAL mastoid effusions. Compared with prior CT, the appearance is similar.  MRA HEAD FINDINGS  The internal carotid arteries are widely patent. The basilar artery is widely patent with both vertebrals contributing. There is no intracranial stenosis or visible aneurysm.  IMPRESSION: No acute intracranial findings. Slight T2 and FLAIR white matter hyperintensities, likely chronic microvascular ischemic change.  Unremarkable MRA intracranial circulation.   Electronically Signed   By: Davonna Belling M.D.   On: 06/10/2014 11:33   Dg Chest Portable 1 View  06/09/2014   CLINICAL DATA:  Shortness of breath and chest pain. Initial encounter  EXAM: PORTABLE CHEST - 1  VIEW  COMPARISON:  02/03/2013  FINDINGS: Very low lung volumes. When accounting for interstitial crowding there is no suspected edema, pneumonia, effusion, or pneumothorax. Prominent heart size and upper mediastinal weight, possibly related to technique. No osseous findings to explain chest pain.  IMPRESSION: No edema or pneumonia suspected. Sensitivity limited by hypoaeration.   Electronically Signed   By: Tiburcio Pea M.D.   On: 06/09/2014 18:51   Mr Maxine Glenn Head/brain Wo Cm  06/10/2014   CLINICAL DATA:  Altered mental status. Diabetes mellitus. Chronic renal disease. Migraines.  EXAM: MRI HEAD WITHOUT CONTRAST  MRA HEAD WITHOUT CONTRAST  TECHNIQUE: Multiplanar, multiecho pulse sequences of the brain and surrounding structures were  obtained without intravenous contrast. Angiographic images of the head were obtained using MRA technique without contrast.  COMPARISON:  CT head 06/09/2014.  FINDINGS: MRI HEAD FINDINGS  No evidence for acute infarction, hemorrhage, mass lesion, hydrocephalus, or extra-axial fluid. Normal for age cerebral volume. Mild periventricular greater than subcortical T2 and FLAIR hyperintensities, likely chronic microvascular ischemic change. Flow voids are maintained throughout the carotid, basilar, and vertebral arteries. There are no areas of chronic hemorrhage. Pituitary, pineal, and cerebellar tonsils unremarkable. No upper cervical lesions. Visualized calvarium, skull base, and upper cervical osseous structures unremarkable. Scalp and extracranial soft tissues, orbits, and sinuses show no acute process. Trace BILATERAL mastoid effusions. Compared with prior CT, the appearance is similar.  MRA HEAD FINDINGS  The internal carotid arteries are widely patent. The basilar artery is widely patent with both vertebrals contributing. There is no intracranial stenosis or visible aneurysm.  IMPRESSION: No acute intracranial findings. Slight T2 and FLAIR white matter hyperintensities, likely  chronic microvascular ischemic change.  Unremarkable MRA intracranial circulation.   Electronically Signed   By: Davonna BellingJohn  Curnes M.D.   On: 06/10/2014 11:33    Microbiology: No results found for this or any previous visit (from the past 240 hour(s)).   Labs: Basic Metabolic Panel:  Recent Labs Lab 06/09/14 1732 06/10/14 0117 06/11/14 0457  NA 141 139 139  K 4.6 4.2 4.6  CL 104 105 105  CO2 23 22 25   GLUCOSE 93 156* 112*  BUN 34* 32* 32*  CREATININE 1.52* 1.46* 1.39*  CALCIUM 9.3 8.6 8.5   Liver Function Tests:  Recent Labs Lab 06/09/14 1732  AST 21  ALT 19  ALKPHOS 87  BILITOT <0.2*  PROT 7.6  ALBUMIN 3.2*   No results found for this basename: LIPASE, AMYLASE,  in the last 168 hours No results found for this basename: AMMONIA,  in the last 168 hours CBC:  Recent Labs Lab 06/09/14 1732 06/10/14 0117 06/11/14 0457  WBC 7.5 7.4 4.5  NEUTROABS 6.0  --   --   HGB 11.1* 9.8* 9.3*  HCT 33.7* 30.4* 28.3*  MCV 81.2 82.8 81.8  PLT 170 182 150   Cardiac Enzymes:  Recent Labs Lab 06/09/14 1732 06/10/14 0117 06/10/14 0730  TROPONINI <0.30 <0.30 <0.30   BNP: BNP (last 3 results)  Recent Labs  06/09/14 1839  PROBNP 555.7*   CBG:  Recent Labs Lab 06/09/14 1808 06/10/14 0129 06/10/14 0754 06/10/14 2054  GLUCAP 88 156* 88 148*    Additional labs: 1. Fasting lipids: Cholesterol 221, triglycerides 277, HDL 52, LDL 114 and VLDL 55 2. Hemoglobin A1c: 6.4 3. UDS: Negative 4. 2-D echo 06/09/14: Study Conclusions  - Left ventricle: The cavity size was normal. Wall thickness was increased in a pattern of mild LVH. Systolic function was normal. The estimated ejection fraction was in the range of 55% to 60%. Wall motion was normal; there were no regional wall motion abnormalities. Doppler parameters are consistent with abnormal left ventricular relaxation (grade 1 diastolic dysfunction). Doppler parameters are consistent with high ventricular  filling pressure. - Aortic valve: There was mild regurgitation. - Mitral valve: Calcified annulus. There was mild regurgitation. - Left atrium: The atrium was mildly dilated.  Impressions:  - Normal LV function; grade 1 diasolic dysfunction; mild LVH; mild MR and AI; mild LAE. 5. Bilateral lower extremity venous duplex and carotid duplex completed.  Preliminary report:  1. Venous: Bilateral: No obvious evidence of DVT, superficial thrombosis, or Baker's Cyst. Limited by pain, edema.  2. Carotid: Bilateral: 1-39% ICA stenosis. Vertebral artery flow is antegrade.       Signed:  Marcellus Scott, MD, FACP, FHM. [facility] Pager [phone]  If 7PM-7AM, please contact night-coverage [email or web address] Password [email or web address] 06/11/2014, 1:27 PM

## 2014-06-11 NOTE — Progress Notes (Signed)
Pt refusing CBG monitoring, and also refusing any insulin, states she does not check her blood sugar at home and does not see the significance of checking it here. Pt educated, MD notified. Cecille Rubinhompson,Mateya Torti V, RN

## 2014-06-12 ENCOUNTER — Ambulatory Visit (HOSPITAL_COMMUNITY): Payer: MEDICAID

## 2014-06-20 ENCOUNTER — Encounter: Payer: Self-pay | Admitting: Family Medicine

## 2014-06-20 ENCOUNTER — Ambulatory Visit: Payer: Self-pay | Attending: Family Medicine | Admitting: Family Medicine

## 2014-06-20 ENCOUNTER — Inpatient Hospital Stay: Payer: Self-pay | Admitting: Family Medicine

## 2014-06-20 ENCOUNTER — Telehealth: Payer: Self-pay | Admitting: Family Medicine

## 2014-06-20 VITALS — BP 173/102 | HR 93 | Temp 97.9°F | Resp 18 | Ht 68.0 in | Wt 328.0 lb

## 2014-06-20 DIAGNOSIS — Z598 Other problems related to housing and economic circumstances: Secondary | ICD-10-CM

## 2014-06-20 DIAGNOSIS — N183 Chronic kidney disease, stage 3 unspecified: Secondary | ICD-10-CM

## 2014-06-20 DIAGNOSIS — Z599 Problem related to housing and economic circumstances, unspecified: Secondary | ICD-10-CM | POA: Insufficient documentation

## 2014-06-20 DIAGNOSIS — I10 Essential (primary) hypertension: Secondary | ICD-10-CM

## 2014-06-20 DIAGNOSIS — E119 Type 2 diabetes mellitus without complications: Secondary | ICD-10-CM

## 2014-06-20 LAB — CBC
HCT: 34.2 % — ABNORMAL LOW (ref 36.0–46.0)
HEMOGLOBIN: 11.3 g/dL — AB (ref 12.0–15.0)
MCH: 26.8 pg (ref 26.0–34.0)
MCHC: 33 g/dL (ref 30.0–36.0)
MCV: 81 fL (ref 78.0–100.0)
PLATELETS: 205 10*3/uL (ref 150–400)
RBC: 4.22 MIL/uL (ref 3.87–5.11)
RDW: 15.2 % (ref 11.5–15.5)
WBC: 6.7 10*3/uL (ref 4.0–10.5)

## 2014-06-20 LAB — BASIC METABOLIC PANEL
BUN: 29 mg/dL — ABNORMAL HIGH (ref 6–23)
CHLORIDE: 107 meq/L (ref 96–112)
CO2: 25 meq/L (ref 19–32)
CREATININE: 1.36 mg/dL — AB (ref 0.50–1.10)
Calcium: 9.1 mg/dL (ref 8.4–10.5)
GLUCOSE: 161 mg/dL — AB (ref 70–99)
Potassium: 4.9 mEq/L (ref 3.5–5.3)
SODIUM: 139 meq/L (ref 135–145)

## 2014-06-20 LAB — GLUCOSE, POCT (MANUAL RESULT ENTRY): POC Glucose: 177 mg/dl — AB (ref 70–99)

## 2014-06-20 MED ORDER — CARVEDILOL 12.5 MG PO TABS: 12.5000 mg | ORAL_TABLET | Freq: Two times a day (BID) | ORAL | Status: DC

## 2014-06-20 MED ORDER — SITAGLIPTIN PHOSPHATE 25 MG PO TABS: 25.0000 mg | ORAL_TABLET | Freq: Every day | ORAL | Status: DC

## 2014-06-20 MED ORDER — LOSARTAN POTASSIUM 50 MG PO TABS: 50.0000 mg | ORAL_TABLET | Freq: Every day | ORAL | Status: DC

## 2014-06-20 NOTE — Assessment & Plan Note (Addendum)
A: well controlled based on A1c. Some elevated CBGs since stopping metformin and amaryl due to worsening CKD. P:  Diet control No sodas Start januvia renal based on CrCl (calculcated at 55) 25 mg daily  CBG checks RN f/u in one week for CBG check

## 2014-06-20 NOTE — Progress Notes (Signed)
HFU Migrane and medication check Pt stated unsure of medication.

## 2014-06-20 NOTE — Patient Instructions (Addendum)
Mrs. Rose Murray,  Thank you for coming in today.   1. HTN: Increase coreg to 12.5 mg BID  Take cozaar 25 mg daily  Continue lasix 20 mg in the evening. Low salt diet. Avoid caffeine   2. DM2: Take januvia 25 mg daily  Avoid soda completely.  3. 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 Armen PickupFunches

## 2014-06-20 NOTE — Telephone Encounter (Signed)
Patient was late for her appointment and needing to review some medications. At the hospital she was prescribed prochlorperazine (COMPAZINE) 10 MG tablet, furosemide (LASIX) 20 MG tablet, carvedilol (COREG) 6.25 MG tablet, nortriptyline (PAMELOR) 25 MG capsule. She is diabetic and was told by dr. Hongalgi to stop taking her diabetes medications (including metformin) for a week and then to make an appt to see her pcp to go over exams taken at the hospital. Please follow up with patient for advise upon medication intake as she is confused as to how long she needs to take her medications for.  

## 2014-06-20 NOTE — Assessment & Plan Note (Signed)
A: BP elevated. No medications in last 24 hrs. Worsening CKD. P: Continue coreg increase to 12.5 mg BID Start cozaar 25 mg daily  Continue low dose lasix 20 mg daily  BMP today BMP in 2-3 weeks  RN BP check in 1 week

## 2014-06-20 NOTE — Progress Notes (Signed)
   Subjective:    Patient ID: Rose Murray, Rose Murray    DOB: 02-27-1966, 48 y.o.   MRN: [id number] CC: HFU migraine, medication review  HPI 48 yo F presents for HFU f/u of migraine:  1. HFU migraine: still with headache. Taking protriptyline. Having some sedation. No nausea or emesis.   2. HTN: taking coreg and lasix. Not taking cozaar. HCTZ discontinued due to acute on chronic CKD. Has HA and LE edema, improved from last visit. No CP or SOB.   3. DM2: no medications currently. Monitor CBG. Cut down on soda significantly. Metformin and amaryl discontinued due to AKI.   4. CKD:  Acute on chronic Cr elevated to 1.5 from baseline 1.2. Patient now HTN uncontrolled. Patient drinks a lot of soda. UA was not suggestive of UTI.   5. Financial difficulties: patient lost her part time job during her past hospitalization. 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   Soc hx: non smoker  Review of Systems As per HPI     Objective:   Physical Exam BP 173/102 mmHg  Pulse 93  Temp(Src) 97.9 F (36.6 C) (Oral)  Resp 18  Ht 5\' 8"  (1.727 m)  Wt 328 lb (148.78 kg)  BMI 49.88 kg/m2  SpO2 98%  LMP 05/25/2014  Wt Readings from Last 3 Encounters:  06/20/14 328 lb (148.78 kg)  06/11/14 325 lb 1.6 oz (147.464 kg)  05/28/14 342 lb (155.13 kg)  General appearance: alert, cooperative, no distress and morbidly obese Lungs: clear to auscultation bilaterally Heart: regular rate and rhythm, S1, S2 normal, no murmur, click, rub or gallop Extremities: edema 1 + b/l with hyperemia, thickened skin and tense bullae   Lab Results  Component Value Date   HGBA1C 6.4* 06/10/2014        Assessment & Plan:

## 2014-06-21 NOTE — Assessment & Plan Note (Signed)
-   Referral to social worker

## 2014-06-21 NOTE — Assessment & Plan Note (Signed)
A: CKD  P:  Checked Cr, stable since d/c Will treat BP, main culprit F/u Cr in 2 weeks

## 2014-06-24 ENCOUNTER — Telehealth: Payer: Self-pay | Admitting: *Deleted

## 2014-06-24 NOTE — Telephone Encounter (Signed)
-----   Message from Lora PaulaJosalyn C Funches, MD sent at 06/21/2014  9:38 AM EST ----- Stable Cr of 1.4, improved Hgb to 11. Continue current medication regimen  Repeat BMP in 2 weeks

## 2014-06-24 NOTE — Telephone Encounter (Signed)
Left voice message to return call 

## 2014-06-25 ENCOUNTER — Ambulatory Visit: Payer: Self-pay | Attending: Family Medicine

## 2014-06-25 NOTE — Telephone Encounter (Signed)
Patient seen later the same day 06/20/14 and meds clarified in office.

## 2014-06-25 NOTE — Telephone Encounter (Signed)
Patient was late for her appointment and needing to review some medications. At the hospital she was prescribed prochlorperazine (COMPAZINE) 10 MG tablet, furosemide (LASIX) 20 MG tablet, carvedilol (COREG) 6.25 MG tablet, nortriptyline (PAMELOR) 25 MG capsule. She is diabetic and was told by dr. Waymon AmatoHongalgi to stop taking her diabetes medications (including metformin) for a week and then to make an appt to see her pcp to go over exams taken at the hospital. Please follow up with patient for advise upon medication intake as she is confused as to how long she needs to take her medications for.

## 2014-06-27 ENCOUNTER — Inpatient Hospital Stay: Payer: Self-pay | Admitting: Family Medicine

## 2014-07-08 ENCOUNTER — Telehealth: Payer: Self-pay | Admitting: *Deleted

## 2014-07-08 NOTE — Telephone Encounter (Signed)
-----   Message from Lora PaulaJosalyn C Funches, MD sent at 06/21/2014 12:31 PM EST ----- Francena HanlyStella, please ask patient to come in for repeat BMP in two weeks. Tywan, please contact patient regarding financial resources.

## 2014-07-08 NOTE — Telephone Encounter (Signed)
Pt stated unable to make appointment at this moment.

## 2014-07-17 ENCOUNTER — Other Ambulatory Visit: Payer: Self-pay | Admitting: Internal Medicine

## 2014-07-17 DIAGNOSIS — E119 Type 2 diabetes mellitus without complications: Secondary | ICD-10-CM

## 2014-07-17 MED ORDER — SITAGLIPTIN PHOSPHATE 25 MG PO TABS: 25.0000 mg | ORAL_TABLET | Freq: Every day | ORAL | Status: AC

## 2014-07-17 MED ORDER — ALBUTEROL SULFATE HFA 108 (90 BASE) MCG/ACT IN AERS: 2.0000 | INHALATION_SPRAY | Freq: Four times a day (QID) | RESPIRATORY_TRACT | Status: AC | PRN

## 2014-08-13 ENCOUNTER — Encounter: Payer: Self-pay | Admitting: Family Medicine

## 2014-08-13 ENCOUNTER — Ambulatory Visit: Payer: Self-pay | Attending: Family Medicine | Admitting: Family Medicine

## 2014-08-13 VITALS — BP 166/87 | HR 84 | Temp 98.8°F | Resp 18 | Ht 68.0 in | Wt 343.0 lb

## 2014-08-13 DIAGNOSIS — G43109 Migraine with aura, not intractable, without status migrainosus: Secondary | ICD-10-CM

## 2014-08-13 DIAGNOSIS — D509 Iron deficiency anemia, unspecified: Secondary | ICD-10-CM

## 2014-08-13 DIAGNOSIS — E119 Type 2 diabetes mellitus without complications: Secondary | ICD-10-CM | POA: Insufficient documentation

## 2014-08-13 DIAGNOSIS — I10 Essential (primary) hypertension: Secondary | ICD-10-CM | POA: Insufficient documentation

## 2014-08-13 DIAGNOSIS — M79674 Pain in right toe(s): Secondary | ICD-10-CM | POA: Insufficient documentation

## 2014-08-13 DIAGNOSIS — G43909 Migraine, unspecified, not intractable, without status migrainosus: Secondary | ICD-10-CM | POA: Insufficient documentation

## 2014-08-13 DIAGNOSIS — G44039 Episodic paroxysmal hemicrania, not intractable: Secondary | ICD-10-CM

## 2014-08-13 DIAGNOSIS — N183 Chronic kidney disease, stage 3 unspecified: Secondary | ICD-10-CM

## 2014-08-13 DIAGNOSIS — I5032 Chronic diastolic (congestive) heart failure: Secondary | ICD-10-CM

## 2014-08-13 DIAGNOSIS — R739 Hyperglycemia, unspecified: Secondary | ICD-10-CM

## 2014-08-13 DIAGNOSIS — M7989 Other specified soft tissue disorders: Secondary | ICD-10-CM | POA: Insufficient documentation

## 2014-08-13 DIAGNOSIS — Z113 Encounter for screening for infections with a predominantly sexual mode of transmission: Secondary | ICD-10-CM

## 2014-08-13 LAB — IRON AND TIBC
%SAT: 18 % — ABNORMAL LOW (ref 20–55)
IRON: 47 ug/dL (ref 42–145)
TIBC: 258 ug/dL (ref 250–470)
UIBC: 211 ug/dL (ref 125–400)

## 2014-08-13 LAB — BASIC METABOLIC PANEL
BUN: 21 mg/dL (ref 6–23)
CALCIUM: 8.7 mg/dL (ref 8.4–10.5)
CO2: 24 mEq/L (ref 19–32)
Chloride: 109 mEq/L (ref 96–112)
Creat: 1.41 mg/dL — ABNORMAL HIGH (ref 0.50–1.10)
GLUCOSE: 223 mg/dL — AB (ref 70–99)
Potassium: 4.4 mEq/L (ref 3.5–5.3)
Sodium: 141 mEq/L (ref 135–145)

## 2014-08-13 LAB — GLUCOSE, POCT (MANUAL RESULT ENTRY): POC Glucose: 226 mg/dl — AB (ref 70–99)

## 2014-08-13 MED ORDER — NORTRIPTYLINE HCL 25 MG PO CAPS: 25.0000 mg | ORAL_CAPSULE | Freq: Every day | ORAL | Status: AC

## 2014-08-13 MED ORDER — PROCHLORPERAZINE MALEATE 10 MG PO TABS: 10.0000 mg | ORAL_TABLET | Freq: Every day | ORAL | Status: AC | PRN

## 2014-08-13 MED ORDER — CARVEDILOL 12.5 MG PO TABS: 12.5000 mg | ORAL_TABLET | Freq: Two times a day (BID) | ORAL | Status: AC

## 2014-08-13 MED ORDER — FUROSEMIDE 20 MG PO TABS: 20.0000 mg | ORAL_TABLET | Freq: Every day | ORAL | Status: DC

## 2014-08-13 MED ORDER — LOSARTAN POTASSIUM 50 MG PO TABS: 50.0000 mg | ORAL_TABLET | Freq: Every day | ORAL | Status: AC

## 2014-08-13 NOTE — Assessment & Plan Note (Signed)
A uncontrolled Meds: non compliant P: Continue coreg Restart losartan 50 mg daily  Restart lasix 20 mg daily  BMP today F/u with RN in 3 weeks if BP elevated and patient compliant with med increase losartan to 100 mg daily

## 2014-08-13 NOTE — Patient Instructions (Signed)
Mrs. Rose Murray,   1. HTN: refilled all BP medications. Lasix, coreg, losartan.  Take as prescribed.   2. Headache: refilled nortriptyline and compazine..   F/u in 3 weeks with the nurse for BP check  F/u in 6 weeks with me.  You will be called with lab results  Dr. Armen PickupFunches

## 2014-08-13 NOTE — Assessment & Plan Note (Signed)
A: elevated CBG today P: Continue [place]uv[place]

## 2014-08-13 NOTE — Assessment & Plan Note (Signed)
A: persistent headaches P: Refill prophylaxis medication, pamelor and compazine

## 2014-08-13 NOTE — Progress Notes (Signed)
   Subjective:    Patient ID: Rose Murray, female    DOB: May 16, 1966, 48 y.o.   MRN: [id number] CC: f/u HTN, migraine headache  HPI [age] y F with DM2, HTN, migraine presents for f/u  1. HTN: taking coreg only. Admits to HA x 5 days. No CP or SOB. Leg swelling.   2. DM2: taking januvia. Has been binging on soda lately.   3. R toe pain: jammed toe into exercise machine last week. Pain and bruising improving. Concerned about the risk of infection. Given her diabetes.   4. Headache: x 5 days. B/l frontal and posterior neck pain.  With nausea, decreased vision in R eye. Sensitivity to light. Denies weakness, numbness, fever.  Patient is out of compazine and pamelor which she stated worked well.   Soc Hx: chronic non smoker  Review of Systems As per HPI     Objective:   Physical Exam BP 178/95 mmHg  Pulse 84  Temp(Src) 98.8 F (37.1 C) (Oral)  Resp 18  Ht 5\' 8"  (1.727 m)  Wt 343 lb (155.584 kg)  BMI 52.17 kg/m2  SpO2 100%  LMP 05/16/2014  BP Readings from Last 3 Encounters:  08/13/14 178/95  06/20/14 173/102  06/11/14 186/82   Wt Readings from Last 3 Encounters:  08/13/14 343 lb (155.584 kg)  06/20/14 328 lb (148.78 kg)  06/11/14 325 lb 1.6 oz (147.464 kg)    General appearance: alert, cooperative and no distress Lungs: clear to auscultation bilaterally Heart: regular rate and rhythm, S1, S2 normal, no murmur, click, rub or gallop Extremities: edema 1 + b/l   Bruising and scar on R 3rd toe. Toe with normal ROM and non tender. No streaking erythema.   Lab Results  Component Value Date   HGBA1C 6.4* 06/10/2014       Assessment & Plan:

## 2014-08-13 NOTE — Progress Notes (Signed)
Complaining of leg swelling. Stated been having Migraine for a few days   Medicine refills

## 2014-08-14 DIAGNOSIS — D509 Iron deficiency anemia, unspecified: Secondary | ICD-10-CM | POA: Insufficient documentation

## 2014-08-14 DIAGNOSIS — I5032 Chronic diastolic (congestive) heart failure: Secondary | ICD-10-CM | POA: Insufficient documentation

## 2014-08-14 LAB — HIV ANTIBODY (ROUTINE TESTING W REFLEX): HIV 1&2 Ab, 4th Generation: NONREACTIVE

## 2014-08-14 LAB — RPR

## 2014-08-14 LAB — PRO B NATRIURETIC PEPTIDE: Pro B Natriuretic peptide (BNP): 1021 pg/mL — ABNORMAL HIGH (ref <126)

## 2014-08-14 LAB — FERRITIN: FERRITIN: 31 ng/mL (ref 10–291)

## 2014-08-14 MED ORDER — FUROSEMIDE 40 MG PO TABS: 40.0000 mg | ORAL_TABLET | Freq: Every day | ORAL | Status: AC

## 2014-08-14 MED ORDER — POTASSIUM CHLORIDE CRYS ER 20 MEQ PO TBCR: 20.0000 meq | EXTENDED_RELEASE_TABLET | Freq: Two times a day (BID) | ORAL | Status: AC

## 2014-08-14 MED ORDER — FERROUS SULFATE 325 (65 FE) MG PO TABS: 325.0000 mg | ORAL_TABLET | Freq: Two times a day (BID) | ORAL | Status: AC

## 2014-08-14 NOTE — Assessment & Plan Note (Signed)
A: elevated proBNP and weight gain and HTN P: Restart lasix with potassium supplement.

## 2014-08-14 NOTE — Assessment & Plan Note (Signed)
A: IDA P: iron supplement

## 2014-08-14 NOTE — Addendum Note (Signed)
Addended by: Dessa PhiFUNCHES, Alnisa Hasley on: 08/14/2014 02:49 PM   Modules accepted: Orders

## 2014-08-15 ENCOUNTER — Telehealth: Payer: Self-pay

## 2014-08-15 NOTE — Telephone Encounter (Signed)
-----   Message from Lora PaulaJosalyn C Funches, MD sent at 08/14/2014  2:40 PM EST ----- Please call patient  Low iron sat, please start oral iron, sent to pharmacy. Stable kidney function, elevated proBNP consistent with fluid overload. Take lasix as prescribed add potassium supplement to prevent low K+. Negative HIV, RPR,

## 2014-08-15 NOTE — Telephone Encounter (Signed)
Attempted to call patient  Patient not available Left message on voice mail to return our call 

## 2015-04-01 ENCOUNTER — Encounter: Payer: Self-pay | Admitting: Pharmacist

## 2016-02-27 IMAGING — MR MR HEAD W/O CM
10 of 12 series · 27 of 48 positions shown · non-contrast
Comparison: CT head 06/09/2014.

CLINICAL DATA: Altered mental status. Diabetes mellitus. Chronic
renal disease. Migraines.

EXAM:
MRI HEAD WITHOUT CONTRAST
MRA HEAD WITHOUT CONTRAST
TECHNIQUE: Multiplanar, multiecho pulse sequences of the brain and surrounding
structures were obtained without intravenous contrast. Angiographic
images of the head were obtained using MRA technique without
contrast.

[Series 2: FLAIR · sagittal · 5.0mm · 0.47mm/px · 1 of 23 slices shown (1 of 2)]
[im 1/23]
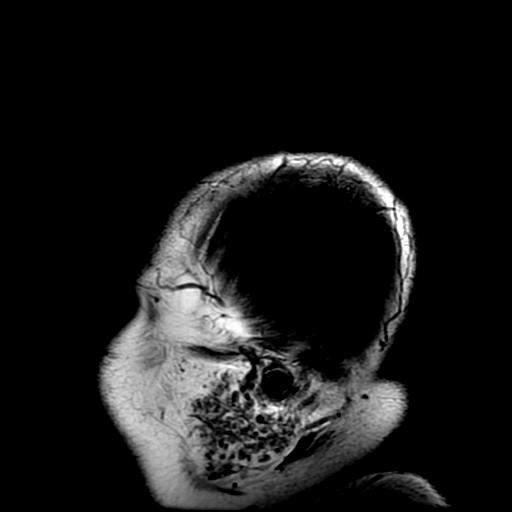

[Series 4: DWI · axial · 5.0mm · 1.02mm/px · z∈[-82,+67]mm · 4 of 62 slices shown (1 of 4)]
[im 1/62]
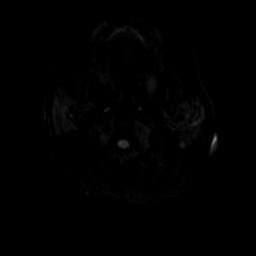
[im 21/62]
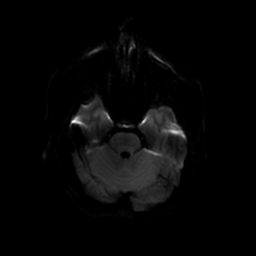
[im 41/62]
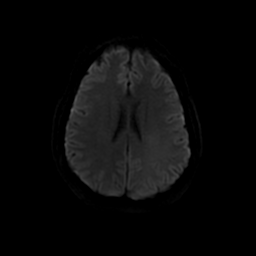
[im 62/62]
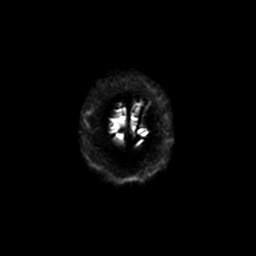

[Series 5: ax (id) 2 · axial · 1.4mm · 0.43mm/px · z∈[-74,+31]mm · 8 of 152 slices shown]
[im 1/152]
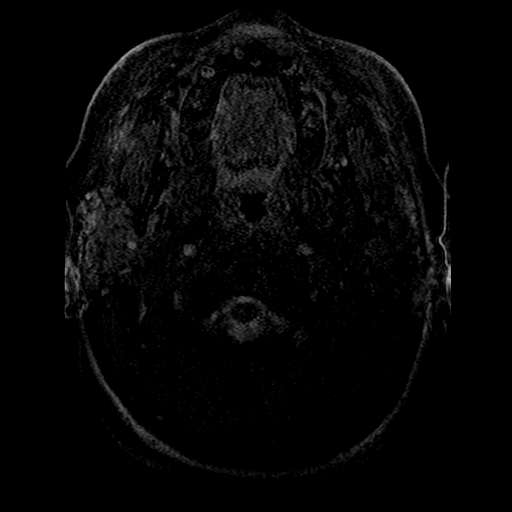
[im 19/152]
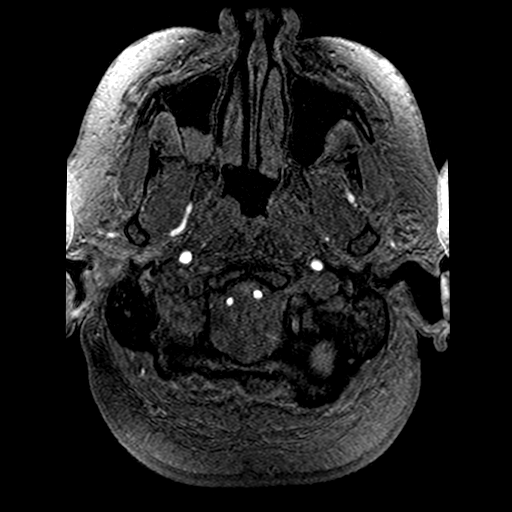
[im 38/152]
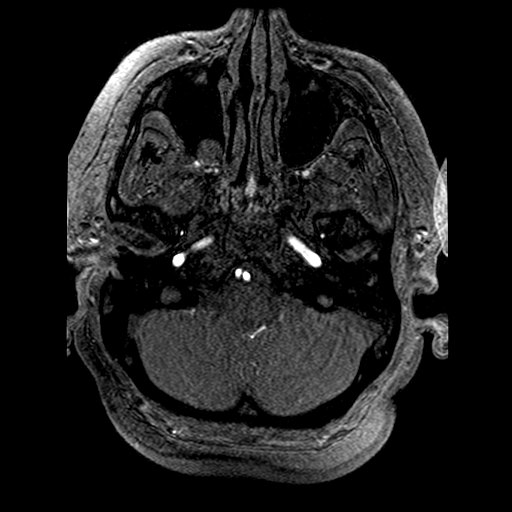
[im 57/152]
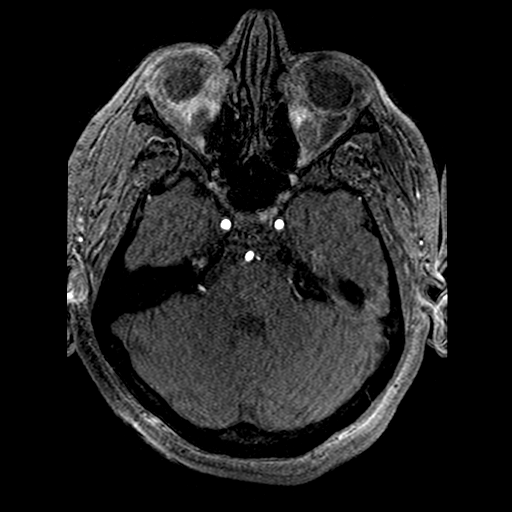
[im 95/152]
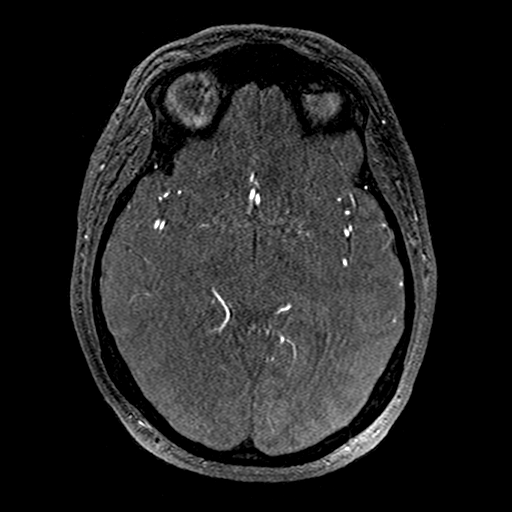
[im 114/152]
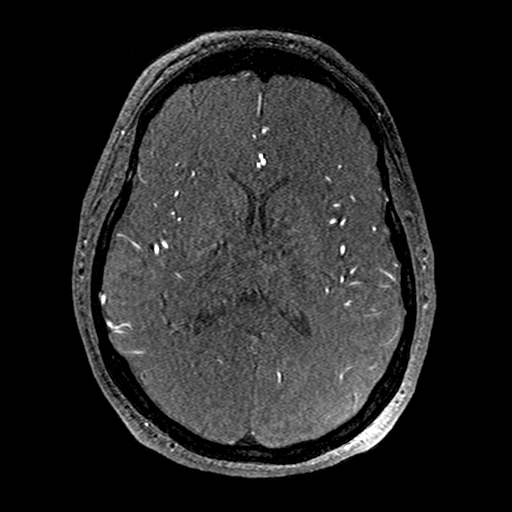
[im 133/152]
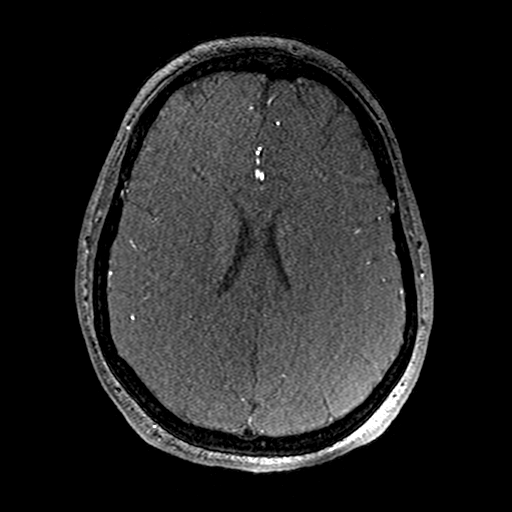
[im 152/152]
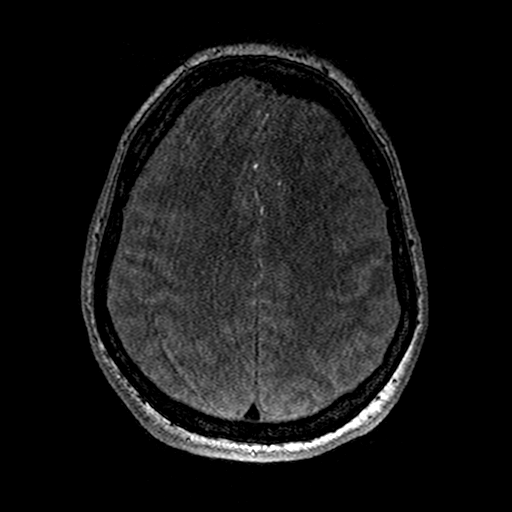

[Series 6: T2 · axial · 5.0mm · 0.43mm/px · 1 of 24 slices shown (1 of 2)]
[im 1/24]
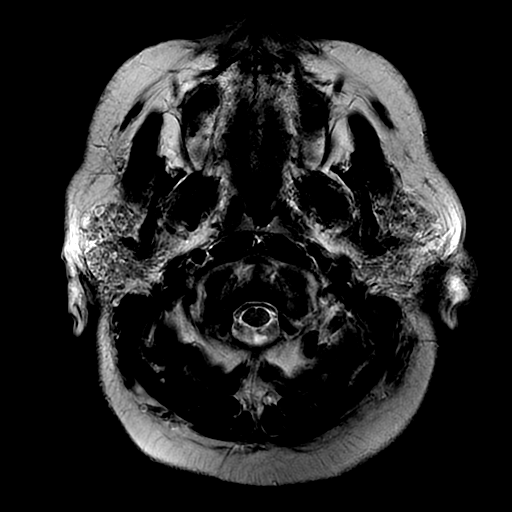

[Series 7: FLAIR · axial · 5.0mm · 0.43mm/px · 1 of 24 slices shown (2 of 2)]
[im 1/24]
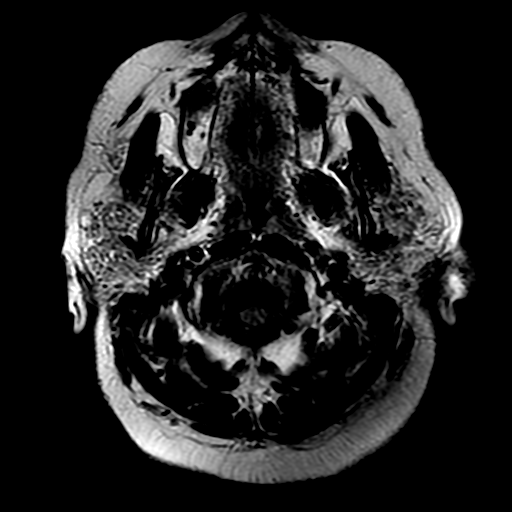

[Series 8: DWI · coronal · 5.0mm · 1.02mm/px · 4 of 66 slices shown (2 of 4)]
[im 1/66]
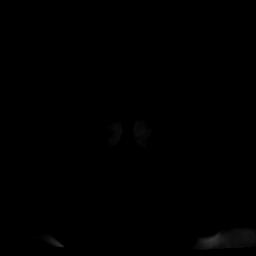
[im 22/66]
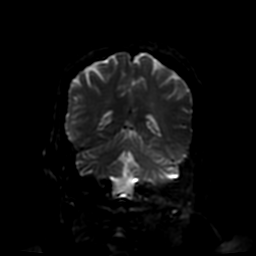
[im 44/66]
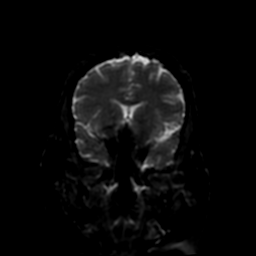
[im 66/66]
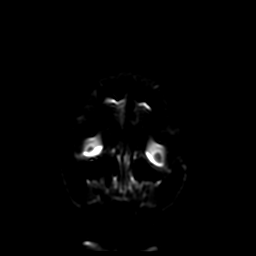

[Series 9: (person_name) · axial · 3.6mm · 0.47mm/px · z∈[-63,-46]mm · 2 of 160 slices shown]
[im 1/160]
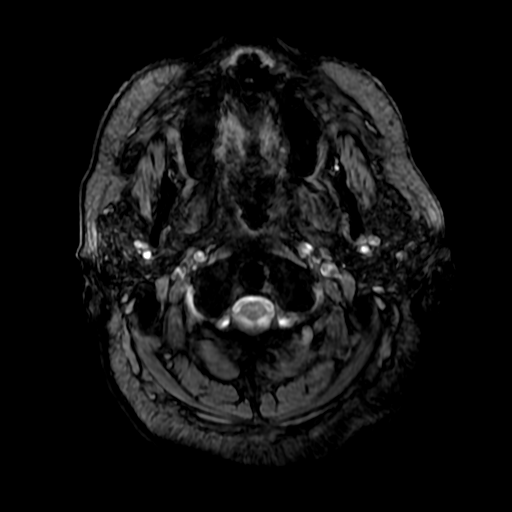
[im 20/160]
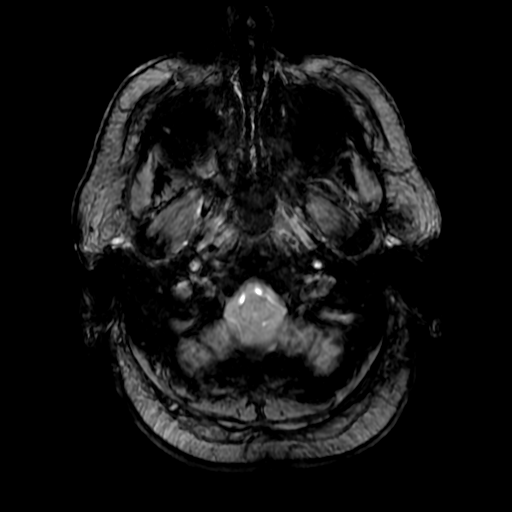

[Series 11: T2 · coronal · 5.0mm · 0.47mm/px · 2 of 28 slices shown (2 of 2)]
[im 1/28]
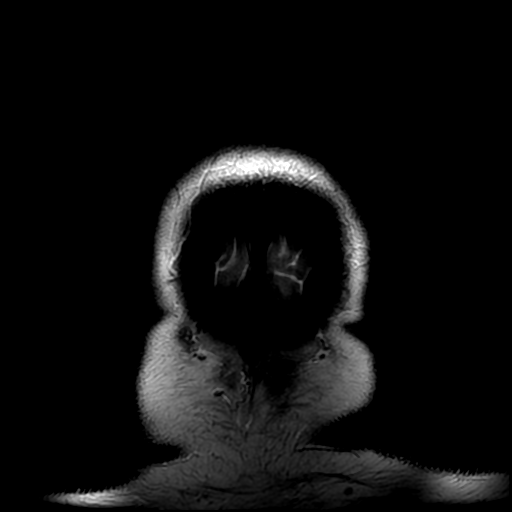
[im 28/28]
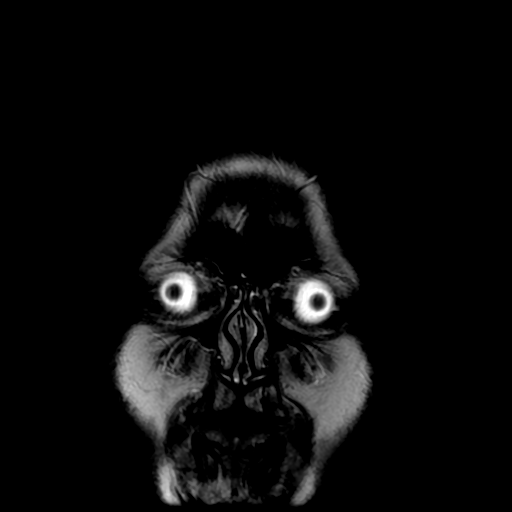

[Series 400: DWI · axial · 5.0mm · 1.02mm/px · z∈[-82,+67]mm · 2 of 31 slices shown (3 of 4)]
[im 1/31]
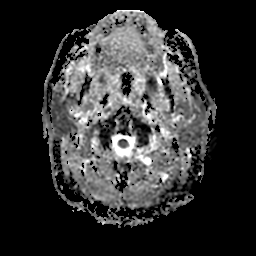
[im 31/31]
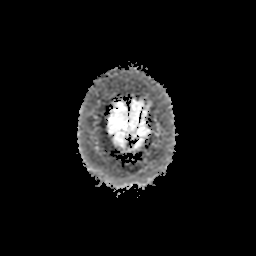

[Series 800: DWI · coronal · 5.0mm · 1.02mm/px · 2 of 33 slices shown (4 of 4)]
[im 1/33]
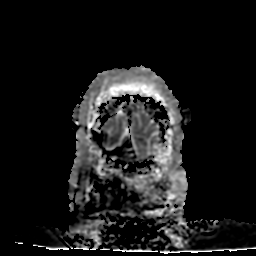
[im 33/33]
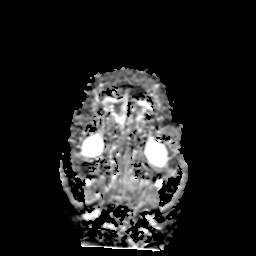

[27 of 48 positions shown; findings below may reference images not displayed]

FINDINGS: MRI HEAD FINDINGS

No evidence for acute infarction, hemorrhage, mass lesion,
hydrocephalus, or extra-axial fluid. Normal for age cerebral volume.
Mild periventricular greater than subcortical T2 and FLAIR
hyperintensities, likely chronic microvascular ischemic change. Flow
voids are maintained throughout the carotid, basilar, and vertebral
arteries. There are no areas of chronic hemorrhage. Pituitary,
pineal, and cerebellar tonsils unremarkable. No upper cervical
lesions. Visualized calvarium, skull base, and upper cervical
osseous structures unremarkable. Scalp and extracranial soft
tissues, orbits, and sinuses show no acute process. Trace BILATERAL
mastoid effusions. Compared with prior CT, the appearance is
similar.

MRA HEAD FINDINGS

The internal carotid arteries are widely patent. The basilar artery
is widely patent with both vertebrals contributing. There is no
intracranial stenosis or visible aneurysm.
IMPRESSION: No acute intracranial findings. Slight T2 and FLAIR white matter
hyperintensities, likely chronic microvascular ischemic change.

Unremarkable MRA intracranial circulation.

## 2021-09-16 DEATH — deceased
